# Patient Record
Sex: Male | Born: 1995 | Race: Black or African American | Hispanic: No | Marital: Single | Smoking: Never smoker
Health system: Southern US, Community
[De-identification: ages and names within clinical notes are randomized; demographics above are authoritative.]

---
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
## 2014-10-17 ENCOUNTER — Emergency Department (HOSPITAL_COMMUNITY)
Admission: EM | Admit: 2014-10-17 | Discharge: 2014-10-17 | Disposition: A | Discharge status: Home / Self Care | Payer: Medicaid Other | Attending: Emergency Medicine | Admitting: Emergency Medicine

## 2014-10-17 ENCOUNTER — Encounter (HOSPITAL_COMMUNITY): Payer: Self-pay

## 2014-10-17 DIAGNOSIS — R197 Diarrhea, unspecified: Secondary | ICD-10-CM | POA: Diagnosis present

## 2014-10-17 DIAGNOSIS — R112 Nausea with vomiting, unspecified: Secondary | ICD-10-CM | POA: Diagnosis not present

## 2014-10-17 LAB — COMPREHENSIVE METABOLIC PANEL
ALK PHOS: 60 U/L (ref 39–117)
ALT: 9 U/L (ref 0–53)
AST: 18 U/L (ref 0–37)
Albumin: 4.3 g/dL (ref 3.5–5.2)
Anion gap: 14 (ref 5–15)
BILIRUBIN TOTAL: 1.2 mg/dL (ref 0.3–1.2)
BUN: 10 mg/dL (ref 6–23)
CHLORIDE: 102 meq/L (ref 96–112)
CO2: 25 meq/L (ref 19–32)
CREATININE: 0.88 mg/dL (ref 0.50–1.35)
Calcium: 9.6 mg/dL (ref 8.4–10.5)
GFR calc Af Amer: >90 mL/min (ref >90)
Glucose, Bld: 81 mg/dL (ref 70–99)
POTASSIUM: 4 meq/L (ref 3.7–5.3)
SODIUM: 141 meq/L (ref 137–147)
Total Protein: 6.8 g/dL (ref 6.0–8.3)

## 2014-10-17 LAB — CBC
HCT: 42.1 % (ref 39.0–52.0)
Hemoglobin: 13.9 g/dL (ref 13.0–17.0)
MCH: 30.5 pg (ref 26.0–34.0)
MCHC: 33 g/dL (ref 30.0–36.0)
MCV: 92.3 fL (ref 78.0–100.0)
PLATELETS: 190 10*3/uL (ref 150–400)
RBC: 4.56 MIL/uL (ref 4.22–5.81)
RDW: 13.2 % (ref 11.5–15.5)
WBC: 6.7 10*3/uL (ref 4.0–10.5)

## 2014-10-17 LAB — LIPASE, BLOOD: Lipase: 21 U/L (ref 11–59)

## 2014-10-17 LAB — I-STAT CG4 LACTIC ACID, ED: Lactic Acid, Venous: 0.86 mmol/L (ref 0.5–2.2)

## 2014-10-17 MED ORDER — SODIUM CHLORIDE 0.9 % IV BOLUS (SEPSIS)
1000.0000 mL | Freq: Once | INTRAVENOUS | Status: AC
  Administered 2014-10-17: 1000 mL via INTRAVENOUS

## 2014-10-17 MED ORDER — ONDANSETRON HCL 4 MG/2ML IJ SOLN
4.0000 mg | Freq: Once | INTRAMUSCULAR | Status: AC
  Administered 2014-10-17: 4 mg via INTRAVENOUS
  Filled 2014-10-17: qty 2

## 2014-10-17 MED ORDER — ONDANSETRON 4 MG PO TBDP: ORAL_TABLET | ORAL | Status: AC

## 2014-10-17 NOTE — Discharge Instructions (Signed)

## 2014-10-17 NOTE — ED Notes (Signed)
Pt notified of needing urine sample.  Urinal at bedside

## 2014-10-17 NOTE — ED Notes (Signed)
Pt able to tolerate PO water and ginger ale, given prescription for nausea medication. Pt states no other needs and is getting dressed to leave at this time.

## 2014-10-17 NOTE — ED Notes (Signed)
Per EMS, pt from home.  Pt states n/v/d x 1 week.  Pt thinks it stems from his depression and stress that he is currently having.  Not taking meds at home.  No prior treatment for depression.  Vitals:  110/80, hr 88, resp 16,

## 2014-10-17 NOTE — ED Notes (Signed)
Ambulated pt through hall, pt tolerated it well. Able to tolerate PO fluids at this time.

## 2014-10-17 NOTE — ED Provider Notes (Signed)
CSN: [id number]     Arrival date & time 10/17/14  0910 History   First MD Initiated Contact with Patient 10/17/14 [phone]     Chief Complaint  Patient presents with  . Emesis  . Diarrhea  . Depression     (Consider location/radiation/quality/duration/timing/severity/associated sxs/prior Treatment) Patient is a 18 y.o. male presenting with vomiting and diarrhea. The history is provided by the patient.  Emesis Severity:  Moderate Duration:  4 days Timing:  Constant Quality:  Stomach contents Progression:  Worsening Chronicity:  New Recent urination:  Normal Relieved by:  Nothing Worsened by:  Nothing tried Associated symptoms: diarrhea   Associated symptoms: no abdominal pain, no chills, no cough, no fever, no headaches, no myalgias and no URI   Diarrhea Associated symptoms: vomiting   Associated symptoms: no abdominal pain, no chills, no recent cough, no fever, no headaches, no myalgias and no URI     History reviewed. No pertinent past medical history. History reviewed. No pertinent past surgical history. History reviewed. No pertinent family history. History  Substance Use Topics  . Smoking status: Never Smoker   . Smokeless tobacco: Not on file  . Alcohol Use: No    Review of Systems  Constitutional: Negative for fever and chills.  Respiratory: Negative for cough and shortness of breath.   Cardiovascular: Negative for chest pain.  Gastrointestinal: Positive for nausea, vomiting and diarrhea. Negative for abdominal pain.  Musculoskeletal: Negative for myalgias.  Neurological: Negative for headaches.  Psychiatric/Behavioral: Positive for dysphoric mood (was depressed, has since resolved).  All other systems reviewed and are negative.     Allergies  Review of patient's allergies indicates no known allergies.  Home Medications   Prior to Admission medications   Medication Sig Start Date End Date Taking? Authorizing Provider  HYDROcodone-acetaminophen  (NORCO/VICODIN) 5-325 MG per tablet Take 2 tablets by mouth every 4 (four) hours as needed for pain. 09/09/12   Roxy Horsemanobert Browning, PA-C   BP 108/61 mmHg  Pulse 53  Temp(Src) 98.5 F (36.9 C) (Oral)  Resp 18  SpO2 100% Physical Exam  Constitutional: He is oriented to person, place, and time. He appears well-developed and well-nourished. No distress.  HENT:  Head: Normocephalic and atraumatic.  Mouth/Throat: Oropharynx is clear and moist. No oropharyngeal exudate.  Eyes: EOM are normal. Pupils are equal, round, and reactive to light.  Neck: Normal range of motion. Neck supple.  Cardiovascular: Normal rate and regular rhythm.  Exam reveals no friction rub.   No murmur heard. Pulmonary/Chest: Effort normal and breath sounds normal. No respiratory distress. He has no wheezes. He has no rales.  Abdominal: Soft. He exhibits no distension. There is no tenderness. There is no rebound.  Musculoskeletal: Normal range of motion. He exhibits no edema.  Neurological: He is alert and oriented to person, place, and time.  Skin: No rash noted. He is not diaphoretic.  Nursing note and vitals reviewed.   ED Course  Procedures (including critical care time) Labs Review Labs Reviewed  CBC  COMPREHENSIVE METABOLIC PANEL  LIPASE, BLOOD  I-STAT CG4 LACTIC ACID, ED    Imaging Review No results found.   EKG Interpretation None      MDM   Final diagnoses:  Nausea vomiting and diarrhea    56M presents with nausea, vomiting, diarrhea for past 4 days. Unable to stop vomiting. No dysuria, no abdominal pain. No fevers. No sick contacts.  He states he is depressed, but is over it now. No SI/HI. Here belly benign, vitals  stable. Will give fluids, anti-emetics, check labs. Labs ok. Tolerating PO after meds. Stable for discharge.    Elwin MochaBlair Vadhir Mcnay, MD 10/17/14 [phone]

## 2014-10-17 NOTE — ED Notes (Signed)
Bed: WA22 Expected date:  Expected time:  Means of arrival:  Comments: EMS-N/V 

## 2014-10-20 ENCOUNTER — Emergency Department (HOSPITAL_COMMUNITY)
Admission: EM | Admit: 2014-10-20 | Discharge: 2014-10-20 | Disposition: A | Discharge status: Home / Self Care | Payer: Medicaid Other | Attending: Emergency Medicine | Admitting: Emergency Medicine

## 2014-10-20 ENCOUNTER — Emergency Department (HOSPITAL_COMMUNITY): Payer: Medicaid Other

## 2014-10-20 ENCOUNTER — Encounter (HOSPITAL_COMMUNITY): Payer: Self-pay | Admitting: *Deleted

## 2014-10-20 DIAGNOSIS — R079 Chest pain, unspecified: Secondary | ICD-10-CM

## 2014-10-20 DIAGNOSIS — R0789 Other chest pain: Secondary | ICD-10-CM

## 2014-10-20 DIAGNOSIS — R0602 Shortness of breath: Secondary | ICD-10-CM | POA: Diagnosis not present

## 2014-10-20 LAB — BASIC METABOLIC PANEL
Anion gap: 11 (ref 5–15)
BUN: 5 mg/dL — AB (ref 6–23)
CALCIUM: 10 mg/dL (ref 8.4–10.5)
CO2: 30 mEq/L (ref 19–32)
Chloride: 104 mEq/L (ref 96–112)
Creatinine, Ser: 1.05 mg/dL (ref 0.50–1.35)
GFR calc Af Amer: >90 mL/min (ref >90)
GLUCOSE: 83 mg/dL (ref 70–99)
Potassium: 3.8 mEq/L (ref 3.7–5.3)
Sodium: 145 mEq/L (ref 137–147)

## 2014-10-20 LAB — D-DIMER, QUANTITATIVE: D-Dimer, Quant: <0.27 ug/mL-FEU (ref 0.00–0.48)

## 2014-10-20 LAB — CBC
HEMATOCRIT: 43.3 % (ref 39.0–52.0)
Hemoglobin: 14.1 g/dL (ref 13.0–17.0)
MCH: 30.1 pg (ref 26.0–34.0)
MCHC: 32.6 g/dL (ref 30.0–36.0)
MCV: 92.3 fL (ref 78.0–100.0)
Platelets: 198 10*3/uL (ref 150–400)
RBC: 4.69 MIL/uL (ref 4.22–5.81)
RDW: 13 % (ref 11.5–15.5)
WBC: 4.5 10*3/uL (ref 4.0–10.5)

## 2014-10-20 LAB — I-STAT TROPONIN, ED: Troponin i, poc: 0 ng/mL (ref 0.00–0.08)

## 2014-10-20 MED ORDER — IBUPROFEN 800 MG PO TABS: 800.0000 mg | ORAL_TABLET | Freq: Three times a day (TID) | ORAL | Status: DC | PRN

## 2014-10-20 MED ORDER — KETOROLAC TROMETHAMINE 60 MG/2ML IM SOLN
60.0000 mg | Freq: Once | INTRAMUSCULAR | Status: AC
  Administered 2014-10-20: 60 mg via INTRAMUSCULAR
  Filled 2014-10-20: qty 2

## 2014-10-20 MED ORDER — HYDROCODONE-ACETAMINOPHEN 5-325 MG PO TABS: 1.0000 | ORAL_TABLET | Freq: Four times a day (QID) | ORAL | Status: DC | PRN

## 2014-10-20 NOTE — ED Provider Notes (Signed)
CSN: [id number]     Arrival date & time 10/20/14  1522 History   First MD Initiated Contact with Patient 10/20/14 1701     Chief Complaint  Patient presents with  . Chest Pain     (Consider location/radiation/quality/duration/timing/severity/associated sxs/prior Treatment) HPI Patient presents to the emergency department with chest pain that started one week ago.  The patient states that he has also noticed that he has had muscle soreness from his job, which he does a lot of lifting.  Patient states that when he takes a deep breath it hurts more and it makes him short of breath when the pain increases.  Patient was recently seen for nausea, vomiting, diarrhea.  Patient states that his nausea and vomiting have subsided, but he is now still having the pain in his chest.  He should states that the pain does not radiate.  Patient denies headache, blurred vision, weakness, dizziness, back pain, neck pain, dysuria, abdominal pain, fever, cough, runny nose, sore throat, decreased appetite, polyuria, polydipsia, rash, or syncope.  The patient states that nothing seems to make his condition, better History reviewed. No pertinent past medical history. History reviewed. No pertinent past surgical history. No family history on file. History  Substance Use Topics  . Smoking status: Never Smoker   . Smokeless tobacco: Not on file  . Alcohol Use: No    Review of Systems  All other systems negative except as documented in the HPI. All pertinent positives and negatives as reviewed in the HPI.=  Allergies  Review of patient's allergies indicates no known allergies.  Home Medications   Prior to Admission medications   Medication Sig Start Date End Date Taking? Authorizing Provider  HYDROcodone-acetaminophen (NORCO/VICODIN) 5-325 MG per tablet Take 1 tablet by mouth every 6 (six) hours as needed for moderate pain. 10/20/14   Jamesetta Orleanshristopher W Jinger Middlesworth, PA-C  ibuprofen (ADVIL,MOTRIN) 800 MG tablet Take 1  tablet (800 mg total) by mouth every 8 (eight) hours as needed. 10/20/14   Jamesetta Orleanshristopher W Edrie Ehrich, PA-C  ondansetron (ZOFRAN ODT) 4 MG disintegrating tablet 4mg  ODT q4 hours prn nausea/vomit Patient not taking: Reported on 10/20/2014 10/17/14   Elwin MochaBlair Walden, MD   BP 122/64 mmHg  Pulse 62  Temp(Src) 98.7 F (37.1 C) (Oral)  Resp 16  SpO2 97% Physical Exam  Constitutional: He is oriented to person, place, and time. He appears well-developed and well-nourished. No distress.  HENT:  Head: Normocephalic and atraumatic.  Mouth/Throat: Oropharynx is clear and moist.  Eyes: Pupils are equal, round, and reactive to light.  Neck: Normal range of motion. Neck supple.  Cardiovascular: Normal rate, regular rhythm and normal heart sounds.  Exam reveals no gallop.   No murmur heard. Pulmonary/Chest: Effort normal and breath sounds normal. No respiratory distress. He has no wheezes. He has no rales. He exhibits tenderness.  Abdominal: Soft. Bowel sounds are normal. He exhibits no distension. There is no tenderness.  Musculoskeletal: He exhibits no edema.  Neurological: He is alert and oriented to person, place, and time. He exhibits normal muscle tone. Coordination normal.  Skin: Skin is warm and dry. No rash noted. No erythema.  Nursing note and vitals reviewed.   ED Course  Procedures (including critical care time) Labs Review Labs Reviewed  BASIC METABOLIC PANEL - Abnormal; Notable for the following:    BUN 5 (*)    All other components within normal limits  CBC  D-DIMER, QUANTITATIVE  I-STAT TROPOININ, ED    Imaging Review Dg Chest 2  View  10/20/2014   CLINICAL DATA:  One week history of cough and shortness of breath; midportion chest pain  EXAM: CHEST  2 VIEW  COMPARISON:  None.  FINDINGS: Lungs are clear. The heart size and pulmonary vascularity are normal. No pneumothorax. No adenopathy. No bone lesions.  IMPRESSION: No abnormality noted.   Electronically Signed   By: Bretta BangWilliam  Woodruff  M.D.   On: 10/20/2014 16:45     EKG Interpretation   Date/Time:  Monday October 20 2014 15:32:52 EST Ventricular Rate:  56 PR Interval:  150 QRS Duration: 90 QT Interval:  377 QTC Calculation: 364 R Axis:   85 Text Interpretation:  Sinus rhythm ST elev, probable normal early repol  pattern No prior for comparison Confirmed by Gwendolyn GrantWALDEN  MD, BLAIR ([id number]) on  10/20/2014 7:26:46 PM     Patient is having atypical chest pain for cardiac disease (ongoing chest soreness in the mid sternal region for the last week.  Pain is reproducible and has hurt more with movements.  Patient has not had any shortness of breath other than when the pain occurs.  He denies dizziness, or vomiting at this time.  Patient is advised to return here as needed MDM   Final diagnoses:  Chest wall pain       Carlyle DollyChristopher W Lolita Faulds, PA-C 10/21/14 0054  Elwin MochaBlair Walden, MD 10/21/14 [phone]

## 2014-10-20 NOTE — ED Notes (Addendum)
Pt reports mid-sternal cp x 4 days ago with SOB, dizziness, and vomiting.  Pt describes pain as tight pressure, states pain is non-radiating.  However, has general body pains from work.  Pt reports pain in his chest is worse when taking a deep breath or with movement

## 2014-10-20 NOTE — Discharge Instructions (Signed)
Return here as needed.  Follow up with a primary care doctor or [facility] urgent care.  Your testing here today was normal.  This is most likely chest wall pain based on your testing

## 2014-11-02 ENCOUNTER — Emergency Department (HOSPITAL_COMMUNITY)
Admission: EM | Admit: 2014-11-02 | Discharge: 2014-11-03 | Disposition: A | Discharge status: Home / Self Care | Payer: Medicaid Other | Attending: Emergency Medicine | Admitting: Emergency Medicine

## 2014-11-02 ENCOUNTER — Encounter (HOSPITAL_COMMUNITY): Payer: Self-pay | Admitting: Emergency Medicine

## 2014-11-02 ENCOUNTER — Emergency Department (HOSPITAL_COMMUNITY): Payer: Medicaid Other

## 2014-11-02 DIAGNOSIS — R079 Chest pain, unspecified: Secondary | ICD-10-CM

## 2014-11-02 DIAGNOSIS — R0789 Other chest pain: Secondary | ICD-10-CM | POA: Insufficient documentation

## 2014-11-02 DIAGNOSIS — R0602 Shortness of breath: Secondary | ICD-10-CM | POA: Insufficient documentation

## 2014-11-02 LAB — CBC
HCT: 45.9 % (ref 39.0–52.0)
Hemoglobin: 15.6 g/dL (ref 13.0–17.0)
MCH: 30.5 pg (ref 26.0–34.0)
MCHC: 34 g/dL (ref 30.0–36.0)
MCV: 89.6 fL (ref 78.0–100.0)
PLATELETS: 204 10*3/uL (ref 150–400)
RBC: 5.12 MIL/uL (ref 4.22–5.81)
RDW: 13 % (ref 11.5–15.5)
WBC: 7.6 10*3/uL (ref 4.0–10.5)

## 2014-11-02 LAB — I-STAT TROPONIN, ED: Troponin i, poc: 0.01 ng/mL (ref 0.00–0.08)

## 2014-11-02 NOTE — ED Notes (Signed)
The pt is c/o generalized chest pain for 2-3 weeks with some n v sob and dizziness.  He has been seen x2 in that time.  His last hosp visit was 2 days ago. Unknown diagnosis.  He has been coughing up bright red blood according to the pt

## 2014-11-02 NOTE — ED Notes (Signed)
Pt. reports mid chest pain onset this evening with SOB and nausea  / occasional dry cough .

## 2014-11-02 NOTE — ED Provider Notes (Signed)
CSN: [id number]     Arrival date & time 11/02/14  2322 History  This chart was scribed for Olivia Mackie, MD by Annye Asa, ED Scribe. This patient was seen in room D34C/D34C and the patient's care was started at 12:03 AM.    Chief Complaint  Patient presents with  . Chest Pain   The history is provided by the patient. No language interpreter was used.     HPI Comments: Alex Sellers is an otherwise healthy 19 y.o. male who presents to the Emergency Department complaining of 2-3 weeks of intermittent chest pains, acutely worsening 2 hours PTA and lasting 1.5 hours before improving. He describes this as a pressure or "heaviness" in the right sided chest and central chest. He also reports SOB with dizziness, lightheadedness, and rapid breathing. He reports mild pain in the central chest at present. He denies tingling in hands, feet, or mouth with this episode.   The last instance of similar symptoms was 3 days PTA - he believes he has two episodes of symptoms per week. He cannot specify any factors that bring on his symptoms. Companion reports that patient was told at [facility] 1 month PTA that he had a heart condition; patient does not recall what specific condition.   Patient denies smoking or illicit drug use. He reports that he recently began a physically strenuous job.   Prior records reviewed, patient was diagnosed with chest wall pain.  At his most recent ED visit.  His EKG, chest x-ray and lab work was all normal.  There is no documentation of any heart condition.  Patient has an odd affect, seems unconcerned that he is in the emergency department, slightly somnolent and laughing at some questions.  History reviewed. No pertinent past medical history. History reviewed. No pertinent past surgical history. No family history on file. History  Substance Use Topics  . Smoking status: Never Smoker   . Smokeless tobacco: Not on file  . Alcohol Use: No    Review of Systems  Respiratory:  Positive for shortness of breath.   Cardiovascular: Positive for chest pain.  Neurological: Positive for dizziness and light-headedness.    Allergies  Review of patient's allergies indicates no known allergies.  Home Medications   Prior to Admission medications   Medication Sig Start Date End Date Taking? Authorizing Provider  HYDROcodone-acetaminophen (NORCO/VICODIN) 5-325 MG per tablet Take 1 tablet by mouth every 6 (six) hours as needed for moderate pain. 10/20/14   Jamesetta Orleans Lawyer, PA-C  ibuprofen (ADVIL,MOTRIN) 800 MG tablet Take 1 tablet (800 mg total) by mouth every 8 (eight) hours as needed. 10/20/14   Carlyle Dolly, PA-C  ondansetron (ZOFRAN ODT) 4 MG disintegrating tablet  ODT q4 hours prn nausea/vomit Patient not taking: Reported on 10/20/2014 10/17/14   Elwin Mocha, MD   There were no vitals taken for this visit. Physical Exam  Constitutional: He is oriented to person, place, and time. He appears well-developed and well-nourished. No distress.  Patient appears somnolent but is arousable.  HENT:  Head: Normocephalic and atraumatic.  Nose: Nose normal.  Mouth/Throat: Oropharynx is clear and moist.  Eyes: Conjunctivae and EOM are normal. Pupils are equal, round, and reactive to light.  Neck: Normal range of motion. Neck supple. No JVD present. No tracheal deviation present. No thyromegaly present.  Cardiovascular: Normal rate, regular rhythm, normal heart sounds and intact distal pulses.  Exam reveals no gallop and no friction rub.   No murmur heard. Pulmonary/Chest: Effort normal  and breath sounds normal. No stridor. No respiratory distress. He has no wheezes. He has no rales. He exhibits no tenderness.  Abdominal: Soft. Bowel sounds are normal. He exhibits no distension and no mass. There is no tenderness. There is no rebound and no guarding.  Musculoskeletal: Normal range of motion. He exhibits no edema or tenderness.  Lymphadenopathy:    He has no  cervical adenopathy.  Neurological: He is alert and oriented to person, place, and time. He displays normal reflexes. He exhibits normal muscle tone. Coordination normal.  Skin: Skin is warm and dry. No rash noted. No erythema. No pallor.  Psychiatric: He has a normal mood and affect. His behavior is normal. Judgment and thought content normal.  Nursing note and vitals reviewed.   ED Course  Procedures   DIAGNOSTIC STUDIES: Oxygen Saturation is 97% on RA, adequate by my interpretation.    COORDINATION OF CARE: 12:14 AM Discussed treatment plan with pt at bedside and pt agreed to plan.   Labs Review Labs Reviewed  CBC  BASIC METABOLIC PANEL  Rosezena Sensor, ED    Imaging Review Dg Chest 2 View  11/03/2014   CLINICAL DATA:  Right-sided chest pain for 2 hr.  EXAM: CHEST  2 VIEW  COMPARISON:  10/20/2014  FINDINGS: The heart size and mediastinal contours are within normal limits. Both lungs are clear. The visualized skeletal structures are unremarkable.  IMPRESSION: No active cardiopulmonary disease.   Electronically Signed   By: Burman Nieves M.D.   On: 11/03/2014 00:08     EKG Interpretation   Date/Time:  Sunday November 02 2014 23:28:18 EST Ventricular Rate:  64 PR Interval:  150 QRS Duration: 82 QT Interval:  388 QTC Calculation: 400 R Axis:   86 Text Interpretation:  Normal sinus rhythm ST elevation, consider early  repolarization Borderline ECG No significant change since last tracing  Confirmed by Aydin Cavalieri  MD, Robena Ewy ([id number]) on 11/02/2014 11:51:30 PM      MDM   Final diagnoses:  Chest pain with low risk for cardiac etiology  Chest wall pain   19 year old male with intermittent chest pain, recently seen for same.  Differential includes anxiety, chest wall pain.  Patient instructed to follow-up with primary care doctor for further workup if symptoms continue after taking NSAIDs.  I personally performed the services described in this documentation, which was scribed  in my presence. The recorded information has been reviewed and is accurate.      Olivia Mackie, MD 11/03/14 [phone]

## 2014-11-02 NOTE — ED Notes (Signed)
Pt to xray

## 2014-11-03 ENCOUNTER — Emergency Department (HOSPITAL_COMMUNITY)
Admission: EM | Admit: 2014-11-03 | Discharge: 2014-11-03 | Disposition: A | Discharge status: Home / Self Care | Payer: Medicaid Other

## 2014-11-03 LAB — BASIC METABOLIC PANEL
Anion gap: 8 (ref 5–15)
BUN: 9 mg/dL (ref 6–23)
CALCIUM: 9.7 mg/dL (ref 8.4–10.5)
CO2: 26 mmol/L (ref 19–32)
Chloride: 103 mEq/L (ref 96–112)
Creatinine, Ser: 0.94 mg/dL (ref 0.50–1.35)
GFR calc Af Amer: >90 mL/min (ref >90)
GLUCOSE: 91 mg/dL (ref 70–99)
Potassium: 3.6 mmol/L (ref 3.5–5.1)
Sodium: 137 mmol/L (ref 135–145)

## 2014-11-03 MED ORDER — NAPROXEN 250 MG PO TABS
500.0000 mg | ORAL_TABLET | Freq: Once | ORAL | Status: AC
  Administered 2014-11-03: 500 mg via ORAL
  Filled 2014-11-03: qty 2

## 2014-11-03 MED ORDER — NAPROXEN 500 MG PO TABS: 500.0000 mg | ORAL_TABLET | Freq: Two times a day (BID) | ORAL | Status: AC

## 2014-11-03 NOTE — ED Notes (Signed)
Pt threw his stickers away and walked out of the waiting room

## 2014-11-03 NOTE — Discharge Instructions (Signed)
You do not have a heart condition.  Ear pain appears to be located in your chest wall and may be due to your recent change in jobs.  Take medications as prescribed.  If your symptoms are not improving in the next 2 weeks, contact your primary care doctor for further workup.   Chest Wall Pain Chest wall pain is pain in or around the bones and muscles of your chest. It may take up to 6 weeks to get better. It may take longer if you must stay physically active in your work and activities.  CAUSES  Chest wall pain may happen on its own. However, it may be caused by:  A viral illness like the flu.  Injury.  Coughing.  Exercise.  Arthritis.  Fibromyalgia.  Shingles. HOME CARE INSTRUCTIONS   Avoid overtiring physical activity. Try not to strain or perform activities that cause pain. This includes any activities using your chest or your abdominal and side muscles, especially if heavy weights are used.  Put ice on the sore area.  Put ice in a plastic bag.  Place a towel between your skin and the bag.  Leave the ice on for 15-20 minutes per hour while awake for the first 2 days.  Only take over-the-counter or prescription medicines for pain, discomfort, or fever as directed by your caregiver. SEEK IMMEDIATE MEDICAL CARE IF:   Your pain increases, or you are very uncomfortable.  You have a fever.  Your chest pain becomes worse.  You have new, unexplained symptoms.  You have nausea or vomiting.  You feel sweaty or lightheaded.  You have a cough with phlegm (sputum), or you cough up blood. MAKE SURE YOU:   Understand these instructions.  Will watch your condition.  Will get help right away if you are not doing well or get worse. Document Released: 10/17/2005 Document Revised: 01/09/2012 Document Reviewed: 06/13/2011 [facility] Patient Information 2015 [place], [place]. This information is not intended to replace advice given to you by your health care provider. Make sure you  discuss any questions you have with your health care provider.  Costochondritis Costochondritis is a condition in which the tissue (cartilage) that connects your ribs with your breastbone (sternum) becomes irritated. It causes pain in the chest and rib area. It usually goes away on its own over time. HOME CARE  Avoid activities that wear you out.  Do not strain your ribs. Avoid activities that use your:  Chest.  Belly.  Side muscles.  Put ice on the area for the first 2 days after the pain starts.  Put ice in a plastic bag.  Place a towel between your skin and the bag.  Leave the ice on for 20 minutes, 2-3 times a day.  Only take medicine as told by your doctor. GET HELP IF:  You have redness or puffiness (swelling) in the rib area.  Your pain does not go away with rest or medicine. GET HELP RIGHT AWAY IF:   Your pain gets worse.  You are very uncomfortable.  You have trouble breathing.  You cough up blood.  You start sweating or throwing up (vomiting).  You have a fever or lasting symptoms for more than 2-3 days.  You have a fever and your symptoms suddenly get worse. MAKE SURE YOU:   Understand these instructions.  Will watch your condition.  Will get help right away if you are not doing well or get worse. Document Released: 04/04/2008 Document Revised: 06/19/2013 Document Reviewed: 05/21/2013 ExitCare  Patient Information 2015 [place], [place]. This information is not intended to replace advice given to you by your health care provider. Make sure you discuss any questions you have with your health care provider.   Emergency Department Resource Guide 1) Find a Doctor and Pay Out of Pocket Although you won't have to find out who is covered by your insurance plan, it is a good idea to ask around and get recommendations. You will then need to call the office and see if the doctor you have chosen will accept you as a new patient and what types of options they  offer for patients who are self-pay. Some doctors offer discounts or will set up payment plans for their patients who do not have insurance, but you will need to ask so you aren't surprised when you get to your appointment.  2) Contact Your Local Health Department Not all health departments have doctors that can see patients for sick visits, but many do, so it is worth a call to see if yours does. If you don't know where your local health department is, you can check in your phone book. The CDC also has a tool to help you locate your state's health department, and many state websites also have listings of all of their local health departments.  3) Find a Walk-in Clinic If your illness is not likely to be very severe or complicated, you may want to try a walk in clinic. These are popping up all over the country in pharmacies, drugstores, and shopping centers. They're usually staffed by nurse practitioners or physician assistants that have been trained to treat common illnesses and complaints. They're usually fairly quick and inexpensive. However, if you have serious medical issues or chronic medical problems, these are probably not your best option.  No Primary Care Doctor: - Call Health Connect at  [phone] - they can help you locate a primary care doctor that  accepts your insurance, provides certain services, etc. - Physician Referral Service- [phone]  Chronic Pain Problems: Organization         Address  Phone   Notes  [facility]  [phone] Patients need to be referred by their primary care doctor.   Medication Assistance: Organization         Address  Phone   Notes  [facility] Medication [facility] [address] [place] [place], [place] [address] [phone] --Must be a resident of [facility] -- Must have NO insurance coverage whatsoever (no Medicaid/ Medicare, etc.) -- The pt. MUST have a primary care doctor that directs their care  regularly and follows them in the community   [facility]ist  [phone]   [employer]  [phone]    Agencies that provide inexpensive medical care: Organization         Address  Phone   Notes  [facility]  [phone]   [facility]    [phone]   [facility] [address] [place], [place] [address] [phone]   [facility] [address], [place] [phone]   [facility]anned Parenthood    [phone]   [facility]    [phone]   [facility]  [address], [place] Phone:  [phone], Fax:  [phone] Hours of Operation:  9 am - 6 pm, M-F.  Also accepts Medicaid/Medicare and self-pay.  [facility]  [address], [place] Phone: [phone], Fax: [phone]. Hours of Operation:  8:30 am - 5:30 pm, M-F.  Also accepts Medicaid and self-pay.  [facility] [place] [address] [place] Phone: [phone]   [facility] [address] Natasha Bence [place], [place] [phone], Ext. [phone] Mondays & Thursdays: 7-9 AM.  First 15 patients are seen on a first come, first serve basis.    Medicaid-accepting [facility] Providers:  Organization         Address  Phone   Notes  [facility] [address], [place] [phone] Also accepts self-pay patients.  [facility] [address] Laurell Josephs [place], [place]  [phone]   [facility] [address], [place] [phone]   [facility] [address] [place] [phone]   Renaye Rakers [address], [place]   [phone] Only accepts [place] Access [place] patients after they have their name applied to their card.   Self-Pay (no insurance) in [facility]:  Organization         Address  Phone    Notes  Sickle Cell Patients, [facility] [address] [place], [place] [phone]   [facility] [address] [place], [place] [phone]   [facility] [place]  [address],  [phone]   [facility]/Dr. Osei-Bonsu  [address], [place] or [address], [place] [phone] Phone number for both [place] and [place] locations is the same.  [facility] [address], [place] [phone]   [facility] [address], [place] or [address] [phone]   [facility] [address] [place] [phone], phone; [phone], fax Sees patients 1st and 3rd Saturday of every month.  Must not qualify for public or private insurance (i.e. Medicaid, Medicare, [place] Health Choice, Veterans' Benefits)  Household income should be no more than 200% of the poverty level The clinic cannot treat you if you are pregnant or think you are pregnant  Sexually transmitted diseases are not treated at the clinic.    Dental Care: Organization         Address  Phone  Notes  [facility] [address] [place], [place] [phone] Accepts children up to age 35 who are enrolled in [place] or [place] Health Choice; pregnant women with a Medicaid card; and children who have applied for Medicaid or [place] Health Choice, but were declined, whose parents can pay a reduced fee at time of service.  [facility]  [address] [place] [address], [place] [phone] Accepts children up to age 78 who are enrolled in [place] or [place] Health Choice; pregnant women with a Medicaid card; and children who have applied for Medicaid or [place] Health Choice, but were declined, whose parents can pay a reduced fee at time of service.  [facility]  [address] [place], [place] [phone] Patients are seen by appointment only. Walk-ins are not accepted. [facility] will see patients 90 years of age and older. Monday - Tuesday (8am-5pm) Most Wednesdays (8:30-5pm) $30 per visit, cash only  [facility]  [address], [facility] [phone] Patients are seen by appointment only. Walk-ins are not accepted. [facility] will see patients 52 years of age and older. One Wednesday Evening (Monthly: Volunteer Based).  $30 per visit, cash only  [employer]  [phone] for adults; Children under age 63, call Graduate Pediatric Dentistry at [phone]. Children aged 50-14, please call [phone] to request a pediatric application.  Dental services are provided in all areas of dental care including fillings, crowns and bridges, complete and partial dentures, implants, gum treatment, root canals, and extractions. Preventive care is also provided. Treatment is provided to both adults and children. Patients are selected via a lottery and there is often a waiting list.   [facility] [address] [place]  [phone] [email or web address]   [facility] [address] [place], [place] [phone], Ext. [phone] Second and Fourth Thursday of each month, opens at 6:30 AM; Clinic ends at 9 AM.  Patients are seen on a first-come first-served basis, and a limited number are seen during each clinic.   [facility]  [address] Ether Griffins [place], [place] [phone]   Eligibility Requirements You must have lived in [place], [place], or [place] counties for at least the last three months.   You cannot be eligible for state or federal sponsored [place], including [employer], [place], or [employer].   You generally cannot be eligible for healthcare insurance through your employer.    How to  apply: Eligibility screenings are held every Tuesday and Wednesday afternoon from 1:00 pm until 4:00 pm. You do not need an appointment for the interview!  [facility] [address] [place], [place] [phone]   [facility] Health Department  [phone]   [facility] Health Department  [phone]   [facility] Health Department  [phone]    Behavioral Health Resources in the Community: Intensive Outpatient Programs Organization         Address  Phone  Notes  [facility] [address], [place], [place] [phone]   [facility] Outpatient [address], [place], [place] [phone]   [facility] [address] [place], [place]  [phone]   [facility] [address]  [place], [place] [phone] or [phone]   Substance Abuse Resources Organization         Address  Phone  Notes  [facility]  [phone]   [facility]  [phone]   [facility] [place]  [phone]   Floydene Flock  [phone]   Residential & Outpatient Substance Abuse Program  [phone]   Psychological Services Organization         Address  Phone  Notes  [facility]  [phone]   [facility]  [phone]   [facility] [address] [place] [phone] or [phone]    Mobile Crisis Teams Organization         Address  Phone  Notes  [facility], Mobile Crisis Care Unit  [phone]   [facility]  [address] [place], [place] [phone]   Doristine Locks [address] [place] [phone]    Self-Help/Support Groups Organization         Address  Phone             Notes  [facility] [place] - variety of support groups  336- I7437963 Call for more information  Narcotics Anonymous (NA), [facility] [address], [employer] [place]  2 meetings at this location   Statistician         Address  Phone  Notes  [facility] [address],    [place]  [phone]   [facility]  [address] [place] [address], [place], [place] [phone]   [facility] [address] [place], [place] [phone] Admissions: 8am-3pm M-F  [facility] [address]    [place], [place] [phone]   [facility] [address] [place], [place], [place] [phone]   [facility] [address]  [place], [place] [phone]   [facility] - Intensive Outpatient [address] Laurell Josephs [address], [place], [place] [phone]   [facility] ([facility] [address] [place]  [place], [place] [phone] or [phone]   [facility] ([facility]) [address], [place], [place] [phone] Accepts Medicaid  [facility] [address]  [place] [phone] Substance Abuse/Addiction Treatment   [facility] Organization         Address  Phone  Notes  [facility]  [phone]   Angie Fava, PhD [address] Ervin Knack [place], [place]   [phone] or [phone]   [facility]   [address] [place], [place] [phone]   [facility] [address], [place], [place] [phone] Insurance/Medicaid/sponsorship through [facility] and Families [address]                                    [place], [place] [phone] Therapy/tele-psych/case  [facility] [address], [place] [phone]    Dr. Lolly Mustache  [phone]   [facility]  [facility] Dept. 1) [address] [place] 2) [address], [place] 3)  [address] [place] [address], [place] [phone]  [phone]   [facility] Child Abuse Hotline  [phone] or [phone] (After Hours)

## 2014-11-03 NOTE — ED Notes (Signed)
Pain med given.  Pt dressed  No discharge instructions.  Pt playing on his cell phone

## 2015-08-13 IMAGING — CR DG CHEST 2V
2 series · 2 of 2 positions shown · non-contrast
Comparison: 10/20/2014

CLINICAL DATA: Right-sided chest pain for 2 hr.

EXAM:
CHEST  2 VIEW

[chest pa]
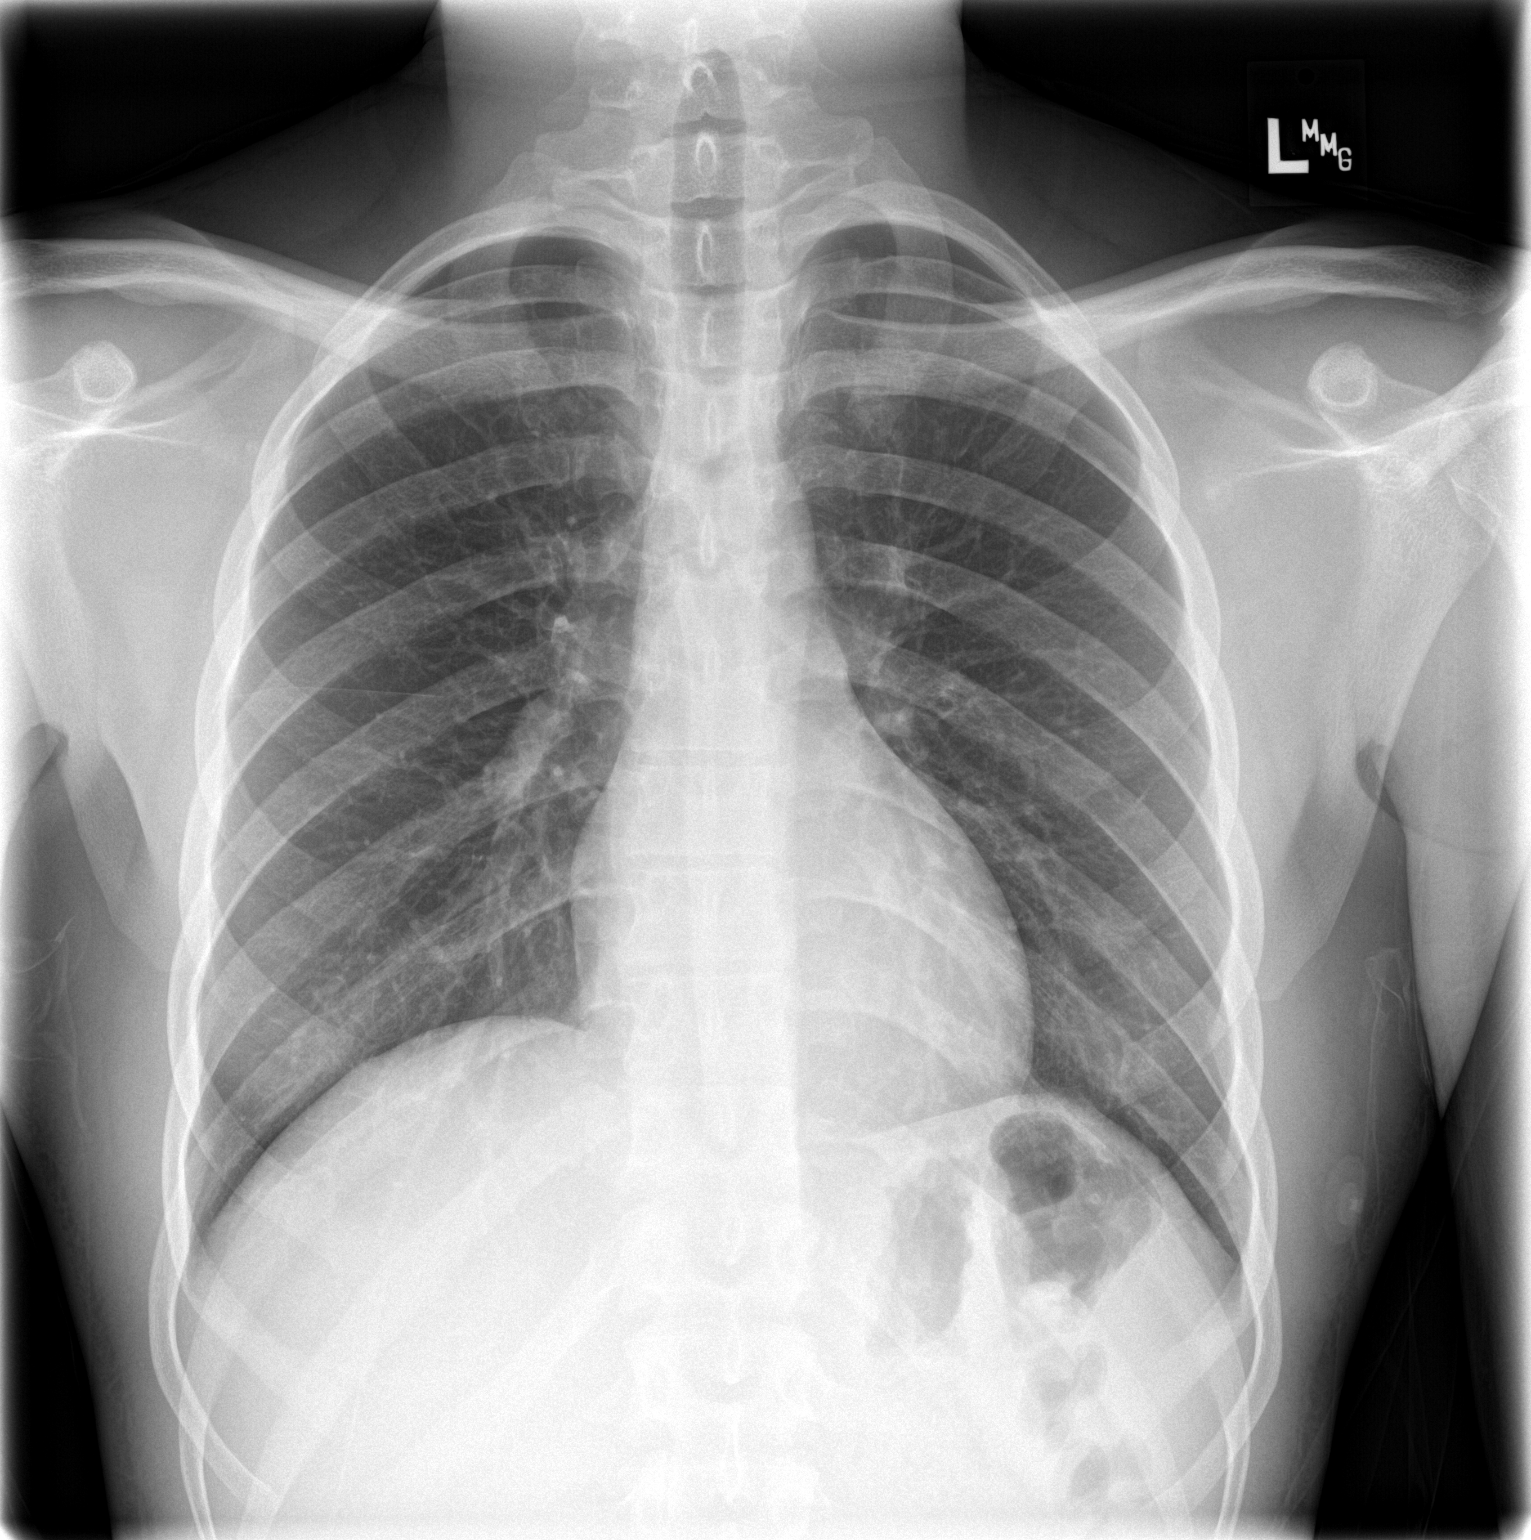

[chest lat]
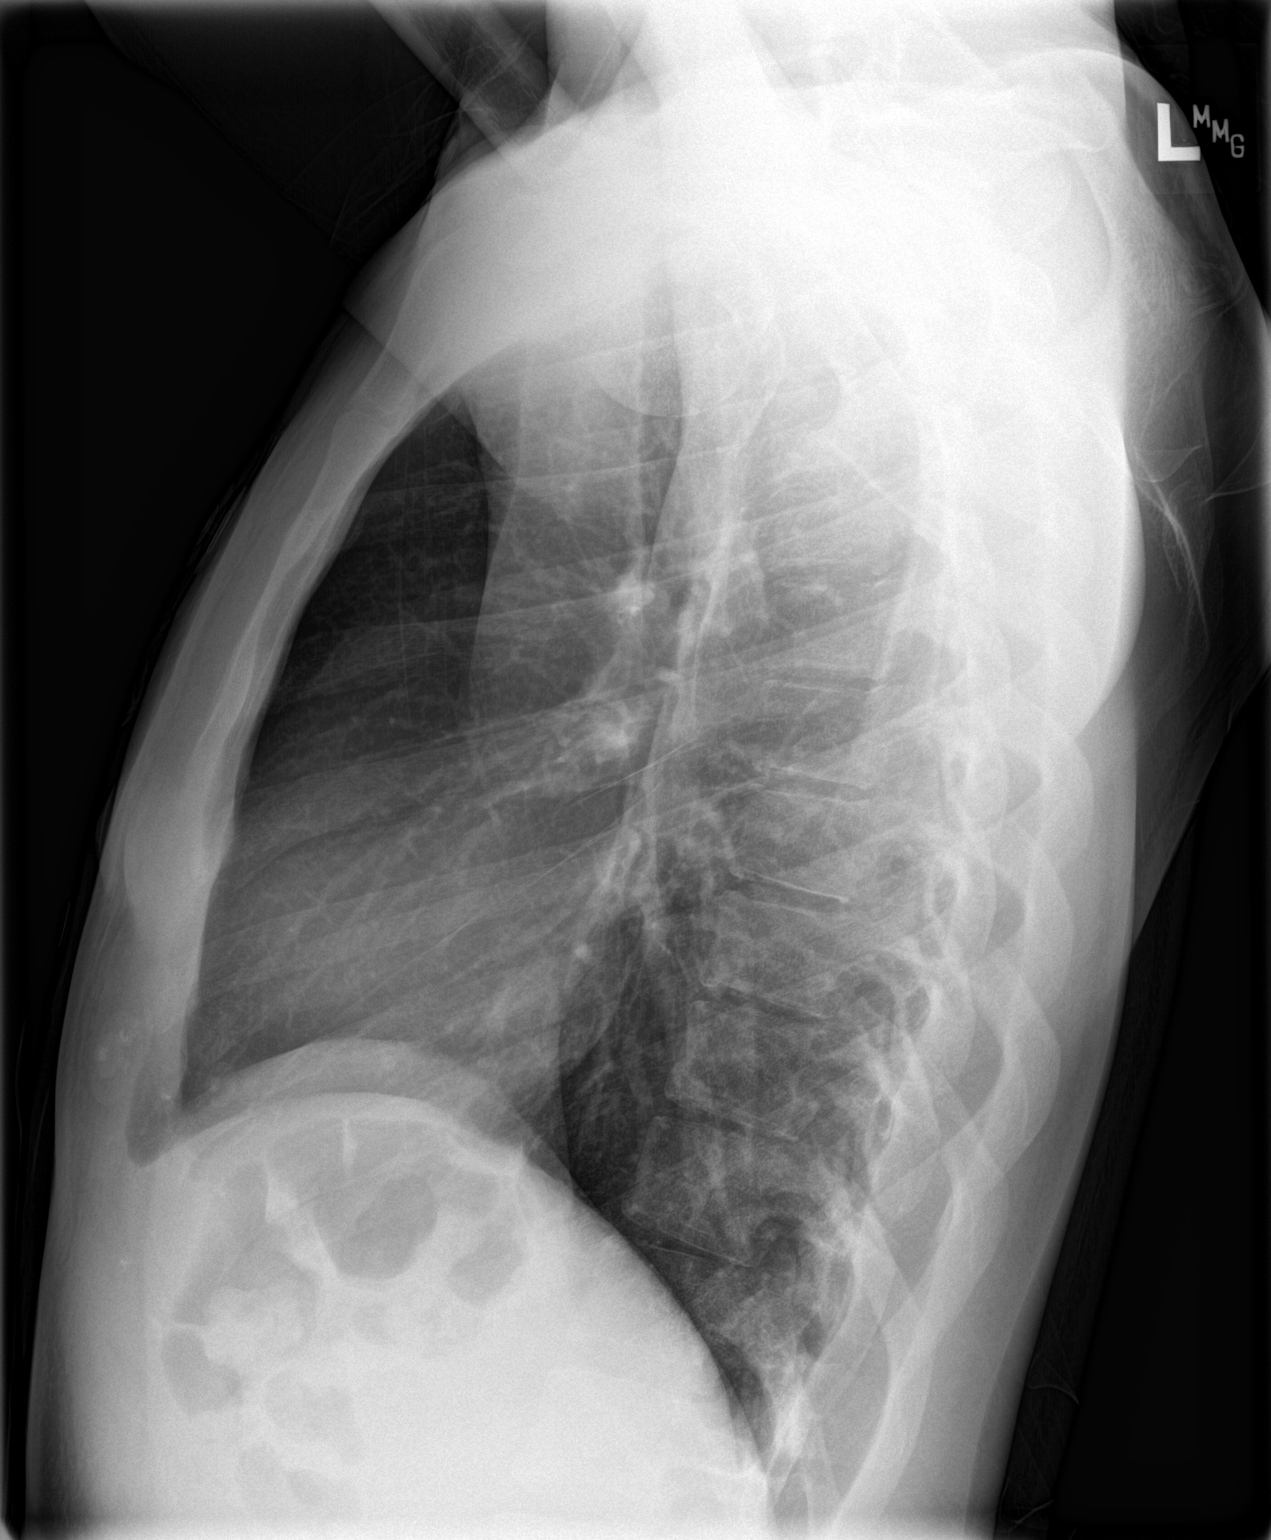

[2 of 2 positions shown; findings below may reference images not displayed]

FINDINGS: The heart size and mediastinal contours are within normal limits.
Both lungs are clear. The visualized skeletal structures are
unremarkable.
IMPRESSION: No active cardiopulmonary disease.

## 2017-02-22 ENCOUNTER — Encounter (HOSPITAL_COMMUNITY): Payer: Self-pay | Admitting: Emergency Medicine

## 2017-02-22 ENCOUNTER — Ambulatory Visit (HOSPITAL_COMMUNITY)
Admission: EM | Admit: 2017-02-22 | Discharge: 2017-02-22 | Disposition: A | Discharge status: Home / Self Care | Payer: Medicaid Other | Attending: Family Medicine | Admitting: Family Medicine

## 2017-02-22 DIAGNOSIS — H1012 Acute atopic conjunctivitis, left eye: Secondary | ICD-10-CM

## 2017-02-22 MED ORDER — CROMOLYN SODIUM 4 % OP SOLN: 2.0000 [drp] | Freq: Four times a day (QID) | OPHTHALMIC | 2 refills | Status: AC

## 2017-02-22 NOTE — ED Triage Notes (Signed)
2 day history of left eye redness, swelling, crusty drainage around eye.  Patient has had cold symptoms-onset saturday

## 2017-02-22 NOTE — ED Provider Notes (Signed)
CSN: [id number]     Arrival date & time 02/22/17  1033 History   First MD Initiated Contact with Patient 02/22/17 1151     Chief Complaint  Patient presents with  . URI   (Consider location/radiation/quality/duration/timing/severity/associated sxs/prior Treatment) 21 year old male presents to clinic with congestion, itchy eyes, and eye redness.   The history is provided by the patient.  Eye Problem  Location:  Left eye Quality:  Tearing (itching) Severity:  Moderate Onset quality:  Gradual Duration:  3 days Timing:  Constant Progression:  Worsening Chronicity:  New Context: not burn, not chemical exposure, not contact lens problem, not direct trauma, not foreign body, not scratch, not smoke exposure and not UV exposure   Exacerbated by: eye drops. Associated symptoms: crusting, discharge (clear), itching, redness and tearing   Associated symptoms: no blurred vision, no decreased vision, no double vision, no headaches and no photophobia   Risk factors: recent URI     History reviewed. No pertinent past medical history. History reviewed. No pertinent surgical history. No family history on file. Social History  Substance Use Topics  . Smoking status: Never Smoker  . Smokeless tobacco: Not on file  . Alcohol use Yes    Review of Systems  Constitutional: Negative for chills and fever.  HENT: Positive for congestion and rhinorrhea.   Eyes: Positive for discharge (clear), redness and itching. Negative for blurred vision, double vision and photophobia.  Respiratory: Negative for cough and shortness of breath.   Cardiovascular: Negative for chest pain and palpitations.  Gastrointestinal: Negative.   Musculoskeletal: Negative.   Skin: Negative.   Neurological: Negative for dizziness and headaches.    Allergies  Patient has no known allergies.  Home Medications   Prior to Admission medications   Medication Sig Start Date End Date Taking? Authorizing Provider  cromolyn  (OPTICROM) 4 % ophthalmic solution Place 2 drops into both eyes 4 (four) times daily. 02/22/17   Dorena Bodo, NP  naproxen (NAPROSYN) 500 MG tablet Take 1 tablet (500 mg total) by mouth 2 (two) times daily with a meal. 11/03/14   Marisa Severin, MD  ondansetron (ZOFRAN ODT) 4 MG disintegrating tablet  ODT q4 hours prn nausea/vomit Patient not taking: Reported on 10/20/2014 10/17/14   Elwin Mocha, MD   Meds Ordered and Administered this Visit  Medications - No data to display  BP 111/76 (BP Location: Right Arm)   Pulse (!) 55   Temp 98.3 F (36.8 C) (Oral)   Resp 18   SpO2 99%  No data found.   Physical Exam  Constitutional: He appears well-developed and well-nourished. No distress.  HENT:  Head: Normocephalic and atraumatic.  Right Ear: Tympanic membrane and external ear normal.  Left Ear: Tympanic membrane and external ear normal.  Nose: Nose normal.  Mouth/Throat: Uvula is midline, oropharynx is clear and moist and mucous membranes are normal.  Eyes: EOM and lids are normal. Pupils are equal, round, and reactive to light. Right conjunctiva is injected. Left conjunctiva is injected.  Neck: Normal range of motion.  Cardiovascular: Normal rate and regular rhythm.   Pulmonary/Chest: Effort normal and breath sounds normal.  Neurological: He is alert.  Skin: Skin is warm and dry. Capillary refill takes less than 2 seconds. He is not diaphoretic.  Psychiatric: He has a normal mood and affect. His behavior is normal.  Nursing note and vitals reviewed.   Urgent Care Course     Procedures (including critical care time)  Labs Review Labs Reviewed - No  data to display  Imaging Review No results found.    MDM   1. Allergic conjunctivitis of left eye     Given Opticrom eyedrops, recommend over-the-counter antihistamine decongestant combination. Return to clinic in 4 days to one week without any relief, or any time if they worsen    Dorena Bodo, NP 02/22/17 1204

## 2017-02-22 NOTE — Discharge Instructions (Signed)
For your conjunctivitis, I prescribed some eyedrops, place 2 drops in both eyes to 4 times a day. I also recommend over-the-counter antihistamine, decongestant combination such as Zyrtec D or Claritin-D. If you do not have any resolution of your symptoms in 4 days to one week, return to clinic, at any time if they worsen return to clinic.

## 2018-07-31 ENCOUNTER — Other Ambulatory Visit: Payer: Self-pay

## 2018-07-31 ENCOUNTER — Encounter (HOSPITAL_COMMUNITY): Payer: Self-pay

## 2018-07-31 ENCOUNTER — Emergency Department (HOSPITAL_COMMUNITY): Payer: Self-pay

## 2018-07-31 ENCOUNTER — Emergency Department (HOSPITAL_COMMUNITY)
Admission: EM | Admit: 2018-07-31 | Discharge: 2018-07-31 | Disposition: A | Discharge status: Home / Self Care | Payer: Self-pay | Attending: Emergency Medicine | Admitting: Emergency Medicine

## 2018-07-31 DIAGNOSIS — N201 Calculus of ureter: Secondary | ICD-10-CM | POA: Insufficient documentation

## 2018-07-31 DIAGNOSIS — Z79899 Other long term (current) drug therapy: Secondary | ICD-10-CM | POA: Insufficient documentation

## 2018-07-31 LAB — COMPREHENSIVE METABOLIC PANEL
ALK PHOS: 63 U/L (ref 38–126)
ALT: 19 U/L (ref 0–44)
AST: 20 U/L (ref 15–41)
Albumin: 4.7 g/dL (ref 3.5–5.0)
Anion gap: 14 (ref 5–15)
BUN: 9 mg/dL (ref 6–20)
CALCIUM: 10.1 mg/dL (ref 8.9–10.3)
CO2: 23 mmol/L (ref 22–32)
Chloride: 107 mmol/L (ref 98–111)
Creatinine, Ser: 1.14 mg/dL (ref 0.61–1.24)
GFR calc Af Amer: >60 mL/min (ref >60)
GFR calc non Af Amer: >60 mL/min (ref >60)
Glucose, Bld: 105 mg/dL — ABNORMAL HIGH (ref 70–99)
POTASSIUM: 3.9 mmol/L (ref 3.5–5.1)
Sodium: 144 mmol/L (ref 135–145)
Total Bilirubin: 1.3 mg/dL — ABNORMAL HIGH (ref 0.3–1.2)
Total Protein: 7.3 g/dL (ref 6.5–8.1)

## 2018-07-31 LAB — URINALYSIS, ROUTINE W REFLEX MICROSCOPIC
BILIRUBIN URINE: NEGATIVE
Bacteria, UA: NONE SEEN
Bilirubin Urine: NEGATIVE
Glucose, UA: NEGATIVE mg/dL
Glucose, UA: NEGATIVE mg/dL
KETONES UR: 5 mg/dL — AB
Ketones, ur: 20 mg/dL — AB
LEUKOCYTES UA: NEGATIVE
Leukocytes, UA: NEGATIVE
NITRITE: NEGATIVE
Nitrite: NEGATIVE
Protein, ur: 100 mg/dL — AB
Protein, ur: NEGATIVE mg/dL
RBC / HPF: >50 RBC/hpf — ABNORMAL HIGH (ref 0–5)
RBC / HPF: >50 RBC/hpf — ABNORMAL HIGH (ref 0–5)
Specific Gravity, Urine: 1.042 — ABNORMAL HIGH (ref 1.005–1.030)
Specific Gravity, Urine: >1.046 — ABNORMAL HIGH (ref 1.005–1.030)
pH: 5 (ref 5.0–8.0)
pH: 5 (ref 5.0–8.0)

## 2018-07-31 LAB — CBC
HEMATOCRIT: 46.1 % (ref 39.0–52.0)
HEMOGLOBIN: 16 g/dL (ref 13.0–17.0)
MCH: 32.7 pg (ref 26.0–34.0)
MCHC: 34.7 g/dL (ref 30.0–36.0)
MCV: 94.3 fL (ref 78.0–100.0)
Platelets: 192 10*3/uL (ref 150–400)
RBC: 4.89 MIL/uL (ref 4.22–5.81)
RDW: 13.3 % (ref 11.5–15.5)
WBC: 9 10*3/uL (ref 4.0–10.5)

## 2018-07-31 LAB — RAPID URINE DRUG SCREEN, HOSP PERFORMED
Amphetamines: NOT DETECTED
Barbiturates: NOT DETECTED
Benzodiazepines: NOT DETECTED
Cocaine: NOT DETECTED
Opiates: POSITIVE — AB
Tetrahydrocannabinol: POSITIVE — AB

## 2018-07-31 LAB — LIPASE, BLOOD: Lipase: 20 U/L (ref 11–51)

## 2018-07-31 MED ORDER — CEPHALEXIN 500 MG PO CAPS: 500.0000 mg | ORAL_CAPSULE | Freq: Two times a day (BID) | ORAL | 0 refills | Status: AC

## 2018-07-31 MED ORDER — KETOROLAC TROMETHAMINE 30 MG/ML IJ SOLN
30.0000 mg | Freq: Once | INTRAMUSCULAR | Status: AC
  Administered 2018-07-31: 30 mg via INTRAVENOUS
  Filled 2018-07-31: qty 1

## 2018-07-31 MED ORDER — SODIUM CHLORIDE 0.9 % IJ SOLN
INTRAMUSCULAR | Status: AC
  Filled 2018-07-31: qty 50

## 2018-07-31 MED ORDER — IOPAMIDOL (ISOVUE-300) INJECTION 61%
INTRAVENOUS | Status: AC
  Filled 2018-07-31: qty 100

## 2018-07-31 MED ORDER — SODIUM CHLORIDE 0.9 % IV BOLUS
1000.0000 mL | Freq: Once | INTRAVENOUS | Status: AC
Start: 2018-07-31 — End: 2018-07-31
  Administered 2018-07-31: 1000 mL via INTRAVENOUS

## 2018-07-31 MED ORDER — IOPAMIDOL (ISOVUE-300) INJECTION 61%
100.0000 mL | Freq: Once | INTRAVENOUS | Status: AC | PRN
  Administered 2018-07-31: 100 mL via INTRAVENOUS

## 2018-07-31 MED ORDER — ONDANSETRON 8 MG PO TBDP: 8.0000 mg | ORAL_TABLET | Freq: Three times a day (TID) | ORAL | 0 refills | Status: AC | PRN

## 2018-07-31 MED ORDER — ONDANSETRON HCL 4 MG/2ML IJ SOLN
4.0000 mg | Freq: Once | INTRAMUSCULAR | Status: AC
  Administered 2018-07-31: 4 mg via INTRAVENOUS
  Filled 2018-07-31: qty 2

## 2018-07-31 MED ORDER — HYDROMORPHONE HCL 1 MG/ML IJ SOLN
0.5000 mg | Freq: Once | INTRAMUSCULAR | Status: AC
Start: 2018-07-31 — End: 2018-07-31
  Administered 2018-07-31: 0.5 mg via INTRAVENOUS
  Filled 2018-07-31: qty 1

## 2018-07-31 MED ORDER — OXYCODONE-ACETAMINOPHEN 5-325 MG PO TABS: 1.0000 | ORAL_TABLET | Freq: Four times a day (QID) | ORAL | 0 refills | Status: DC | PRN

## 2018-07-31 NOTE — ED Provider Notes (Signed)
[facility]   CSN: [id number] Arrival date & time: 07/31/18  1358     History   Chief Complaint Chief Complaint  Patient presents with  . Abdominal Pain    HPI Alex Sellers is a 22 y.o. male.  Patient c/o rlq abd pain, gradual onset last pm. Pain constant, dull, worsening, non radiating, mod-severe. No dysuria or hematuria. No hx kidney stones. No scrotal or testicular pain. No prior abd surgery. Nausea/decreased appetite. No vomiting or diarrhea. Pt difficult historian, unusual affect. Denies same pain prior.   The history is provided by the patient and the EMS personnel.  Abdominal Pain   Pertinent negatives include fever and headaches.    No past medical history on file.  There are no active problems to display for this patient.   No past surgical history on file.      Home Medications    Prior to Admission medications   Medication Sig Start Date End Date Taking? Authorizing Provider  cromolyn (OPTICROM) 4 % ophthalmic solution Place 2 drops into both eyes 4 (four) times daily. 02/22/17   Dorena Bodo, NP  naproxen (NAPROSYN) 500 MG tablet Take 1 tablet (500 mg total) by mouth 2 (two) times daily with a meal. 11/03/14   Marisa Severin, MD  ondansetron (ZOFRAN ODT) 4 MG disintegrating tablet 4mg  ODT q4 hours prn nausea/vomit Patient not taking: Reported on 10/20/2014 10/17/14   Elwin Mocha, MD    Family History No family history on file.  Social History Social History   Tobacco Use  . Smoking status: Never Smoker  . Smokeless tobacco: Never Used  Substance Use Topics  . Alcohol use: Yes  . Drug use: No     Allergies   Patient has no known allergies.   Review of Systems Review of Systems  Constitutional: Negative for fever.  HENT: Negative for sore throat.   Eyes: Negative for redness.  Respiratory: Negative for shortness of breath.   Cardiovascular: Negative for chest pain.  Gastrointestinal:  Positive for abdominal pain.  Endocrine: Negative for polyuria.  Genitourinary: Negative for flank pain.  Musculoskeletal: Negative for back pain.  Skin: Negative for rash.  Neurological: Negative for headaches.  Hematological: Does not bruise/bleed easily.  Psychiatric/Behavioral: Negative for confusion.     Physical Exam Updated Vital Signs BP 134/84 (BP Location: Right Arm)   Pulse 98   Temp (!) 97.5 F (36.4 C) (Oral)   Resp 16   Ht 1.651 m (5\' 5" )   Wt 70.3 kg   SpO2 100%   BMI 25.79 kg/m   Physical Exam  Constitutional: He appears well-developed and well-nourished.  HENT:  Mouth/Throat: Oropharynx is clear and moist.  Eyes: Conjunctivae are normal. No scleral icterus.  Neck: Neck supple. No tracheal deviation present.  Cardiovascular: Normal rate, regular rhythm, normal heart sounds and intact distal pulses. Exam reveals no gallop and no friction rub.  No murmur heard. Pulmonary/Chest: Effort normal and breath sounds normal. No accessory muscle usage. No respiratory distress.  Abdominal: Soft. Bowel sounds are normal. He exhibits no distension and no mass. There is tenderness. There is no rebound and no guarding. No hernia.  Moderate rlq tenderness.   Genitourinary:  Genitourinary Comments: No cva tenderness. No scrotal or testicular pain/swelling/tenderness.   Musculoskeletal: He exhibits no edema.  Neurological: He is alert.  Skin: Skin is warm and dry.  Psychiatric:  Unusual affect, slow to respond to questions.   Nursing note and vitals reviewed.  ED Treatments / Results  Labs (all labs ordered are listed, but only abnormal results are displayed) Results for orders placed or performed during the hospital encounter of 07/31/18  Lipase, blood  Result Value Ref Range   Lipase 20 11 - 51 U/L  Comprehensive metabolic panel  Result Value Ref Range   Sodium 144 135 - 145 mmol/L   Potassium 3.9 3.5 - 5.1 mmol/L   Chloride 107 98 - 111 mmol/L   CO2 23 22 -  32 mmol/L   Glucose, Bld 105 (H) 70 - 99 mg/dL   BUN 9 6 - 20 mg/dL   Creatinine, Ser 4.09 0.61 - 1.24 mg/dL   Calcium 81.1 8.9 - 91.4 mg/dL   Total Protein 7.3 6.5 - 8.1 g/dL   Albumin 4.7 3.5 - 5.0 g/dL   AST 20 15 - 41 U/L   ALT 19 0 - 44 U/L   Alkaline Phosphatase 63 38 - 126 U/L   Total Bilirubin 1.3 (H) 0.3 - 1.2 mg/dL   GFR calc non Af Amer >60 >60 mL/min   GFR calc Af Amer >60 >60 mL/min   Anion gap 14 5 - 15  CBC  Result Value Ref Range   WBC 9.0 4.0 - 10.5 K/uL   RBC 4.89 4.22 - 5.81 MIL/uL   Hemoglobin 16.0 13.0 - 17.0 g/dL   HCT 78.2 95.6 - 21.3 %   MCV 94.3 78.0 - 100.0 fL   MCH 32.7 26.0 - 34.0 pg   MCHC 34.7 30.0 - 36.0 g/dL   RDW 08.6 57.8 - 46.9 %   Platelets 192 150 - 400 K/uL   Ct Abdomen Pelvis W Contrast  Result Date: 07/31/2018 CLINICAL DATA:  Right lower quadrant abdominal pain. EXAM: CT ABDOMEN AND PELVIS WITH CONTRAST TECHNIQUE: Multidetector CT imaging of the abdomen and pelvis was performed using the standard protocol following bolus administration of intravenous contrast. CONTRAST:  ISOVUE-300 IOPAMIDOL (ISOVUE-300) INJECTION 61% COMPARISON:  None. FINDINGS: Lower chest: No acute abnormality. Hepatobiliary: No focal liver abnormality is seen. No gallstones, gallbladder wall thickening, or biliary dilatation. Pancreas: Unremarkable. No pancreatic ductal dilatation or surrounding inflammatory changes. Spleen: Normal in size without focal abnormality. Adrenals/Urinary Tract: Adrenal glands appear normal. Bilateral nephrolithiasis is noted. Moderate right hydroureteronephrosis is noted secondary to 9 mm calculus in distal right ureter. Urinary bladder is unremarkable. Stomach/Bowel: Stomach is within normal limits. Appendix appears normal. No evidence of bowel wall thickening, distention, or inflammatory changes. Vascular/Lymphatic: No significant vascular findings are present. No enlarged abdominal or pelvic lymph nodes. Reproductive: Prostate is  unremarkable. Other: No abdominal wall hernia or abnormality. No abdominopelvic ascites. Musculoskeletal: No acute or significant osseous findings. IMPRESSION: Bilateral nephrolithiasis. Moderate right hydroureteronephrosis is noted secondary to 9 mm distal right ureteral calculus. Electronically Signed   By: Lupita Raider, M.D.   On: 07/31/2018 16:14    EKG None  Radiology Ct Abdomen Pelvis W Contrast  Result Date: 07/31/2018 CLINICAL DATA:  Right lower quadrant abdominal pain. EXAM: CT ABDOMEN AND PELVIS WITH CONTRAST TECHNIQUE: Multidetector CT imaging of the abdomen and pelvis was performed using the standard protocol following bolus administration of intravenous contrast. CONTRAST:  ISOVUE-300 IOPAMIDOL (ISOVUE-300) INJECTION 61% COMPARISON:  None. FINDINGS: Lower chest: No acute abnormality. Hepatobiliary: No focal liver abnormality is seen. No gallstones, gallbladder wall thickening, or biliary dilatation. Pancreas: Unremarkable. No pancreatic ductal dilatation or surrounding inflammatory changes. Spleen: Normal in size without focal abnormality. Adrenals/Urinary Tract: Adrenal glands appear normal. Bilateral nephrolithiasis is  noted. Moderate right hydroureteronephrosis is noted secondary to 9 mm calculus in distal right ureter. Urinary bladder is unremarkable. Stomach/Bowel: Stomach is within normal limits. Appendix appears normal. No evidence of bowel wall thickening, distention, or inflammatory changes. Vascular/Lymphatic: No significant vascular findings are present. No enlarged abdominal or pelvic lymph nodes. Reproductive: Prostate is unremarkable. Other: No abdominal wall hernia or abnormality. No abdominopelvic ascites. Musculoskeletal: No acute or significant osseous findings. IMPRESSION: Bilateral nephrolithiasis. Moderate right hydroureteronephrosis is noted secondary to 9 mm distal right ureteral calculus. Electronically Signed   By: Lupita Raider, M.D.   On: 07/31/2018 16:14      Procedures Procedures (including critical care time)  Medications Ordered in ED Medications  sodium chloride 0.9 % bolus 1,000 mL (has no administration in time range)  HYDROmorphone (DILAUDID) injection 0.5 mg (has no administration in time range)  ondansetron (ZOFRAN) injection 4 mg (has no administration in time range)     Initial Impression / Assessment and Plan / ED Course  I have reviewed the triage vital signs and the nursing notes.  Pertinent labs & imaging results that were available during my care of the patient were reviewed by me and considered in my medical decision making (see chart for details).  Iv ns bolus. Labs.   Reviewed nursing notes and prior charts for additional history.   Dilaudid .5 mg iv. zofran iv. Imaging ordered.   Ct reviewed - right ureteral stone.   toradol iv.   Pain improved.   Labs reviewed - chem normal. ua pending.   Signed out to Dr Mammie Russian to check ua, reassess pain control.    Final Clinical Impressions(s) / ED Diagnoses   Final diagnoses:  None    ED Discharge Orders    None       Cathren Laine, MD 07/31/18 1624

## 2018-07-31 NOTE — ED Notes (Signed)
Per order of the urologist; we have just sent a repeat u/a with very careful mid-stream technique. He is much more comfortable and in no distress.

## 2018-07-31 NOTE — ED Notes (Signed)
Bed: WA07 Expected date:  Expected time:  Means of arrival:  Comments: EMS-abdominal pain 

## 2018-07-31 NOTE — ED Notes (Signed)
Informed pt that there is a need for urine.

## 2018-07-31 NOTE — ED Provider Notes (Signed)
Patient care assumed at 1600. Patient here for evaluation of right sided pain, has a distal 9 mm ureteral stone. UA is concerning for UTI with bacteria M WBCs present but patient does not have any concerning historical feature for UTI. Discussed with urologist on-call, who recommends repeat UA with midstream urine catch and culture off of repeat UA. Repeat UA is not consistent with UTI, no bacteria, few WBC. Patient continues to be well appearing, pain is gone. Will start antibiotics to help prevent development of infection. Plan to discharge home with close urology follow-up as well as close return precautions.   Tilden Fossa, MD 07/31/18 [phone]

## 2018-07-31 NOTE — Discharge Instructions (Addendum)
It was our pleasure to provide your ER care today - we hope that you feel better.  The ct scan shows a 9 mm kidney stone in the right ureter, as well as bilateral kidney stones.   Strain urine. Drink plenty of fluids.   Take motrin or aleve as need for pain. You may also take percocet as need for pain. No driving for the next 6 hours or when taking percocet. Also, do not take tylenol or acetaminophen containing medication when taking percocet.  Take zofran as need if nauseated.   Follow up with urologist in the next few days if symptoms fail to improve/resolve - see referral - call office for appointment.   Return to ER if worse, new symptoms, fevers, persistent vomiting, intractable pain, other concern.

## 2018-07-31 NOTE — ED Triage Notes (Signed)
He c/o rlq area abd. Pain since yesterday. He is in no distress. He maintains his position of comfort, which is to be in rather a fetal position.

## 2018-08-02 LAB — URINE CULTURE: Culture: NO GROWTH

## 2018-08-16 ENCOUNTER — Encounter (HOSPITAL_COMMUNITY): Payer: Self-pay

## 2018-08-16 ENCOUNTER — Emergency Department (HOSPITAL_COMMUNITY)
Admission: EM | Admit: 2018-08-16 | Discharge: 2018-08-16 | Disposition: A | Discharge status: Home / Self Care | Payer: Self-pay | Attending: Emergency Medicine | Admitting: Emergency Medicine

## 2018-08-16 ENCOUNTER — Other Ambulatory Visit: Payer: Self-pay

## 2018-08-16 ENCOUNTER — Emergency Department (HOSPITAL_COMMUNITY): Payer: Self-pay

## 2018-08-16 DIAGNOSIS — Z79899 Other long term (current) drug therapy: Secondary | ICD-10-CM | POA: Insufficient documentation

## 2018-08-16 DIAGNOSIS — N23 Unspecified renal colic: Secondary | ICD-10-CM | POA: Insufficient documentation

## 2018-08-16 LAB — URINALYSIS, ROUTINE W REFLEX MICROSCOPIC
BILIRUBIN URINE: NEGATIVE
Bacteria, UA: NONE SEEN
GLUCOSE, UA: NEGATIVE mg/dL
Ketones, ur: 20 mg/dL — AB
Leukocytes, UA: NEGATIVE
Nitrite: NEGATIVE
Protein, ur: 100 mg/dL — AB
RBC / HPF: >50 RBC/hpf — ABNORMAL HIGH (ref 0–5)
SPECIFIC GRAVITY, URINE: 1.023 (ref 1.005–1.030)
pH: 6 (ref 5.0–8.0)

## 2018-08-16 LAB — COMPREHENSIVE METABOLIC PANEL
ALT: 16 U/L (ref 0–44)
AST: 32 U/L (ref 15–41)
Albumin: 4.6 g/dL (ref 3.5–5.0)
Alkaline Phosphatase: 62 U/L (ref 38–126)
Anion gap: 17 — ABNORMAL HIGH (ref 5–15)
BUN: 11 mg/dL (ref 6–20)
CHLORIDE: 104 mmol/L (ref 98–111)
CO2: 20 mmol/L — ABNORMAL LOW (ref 22–32)
Calcium: 9.9 mg/dL (ref 8.9–10.3)
Creatinine, Ser: 1.45 mg/dL — ABNORMAL HIGH (ref 0.61–1.24)
Glucose, Bld: 100 mg/dL — ABNORMAL HIGH (ref 70–99)
POTASSIUM: 5.4 mmol/L — AB (ref 3.5–5.1)
Sodium: 141 mmol/L (ref 135–145)
Total Bilirubin: 1.8 mg/dL — ABNORMAL HIGH (ref 0.3–1.2)
Total Protein: 7.5 g/dL (ref 6.5–8.1)

## 2018-08-16 LAB — CBC
HEMATOCRIT: 46.4 % (ref 39.0–52.0)
HEMOGLOBIN: 15.4 g/dL (ref 13.0–17.0)
MCH: 32 pg (ref 26.0–34.0)
MCHC: 33.2 g/dL (ref 30.0–36.0)
MCV: 96.3 fL (ref 80.0–100.0)
NRBC: 0 % (ref 0.0–0.2)
Platelets: 203 10*3/uL (ref 150–400)
RBC: 4.82 MIL/uL (ref 4.22–5.81)
RDW: 13.4 % (ref 11.5–15.5)
WBC: 12.1 10*3/uL — AB (ref 4.0–10.5)

## 2018-08-16 LAB — LIPASE, BLOOD: LIPASE: 26 U/L (ref 11–51)

## 2018-08-16 MED ORDER — ONDANSETRON HCL 4 MG/2ML IJ SOLN
4.0000 mg | Freq: Once | INTRAMUSCULAR | Status: AC
  Administered 2018-08-16: 4 mg via INTRAVENOUS
  Filled 2018-08-16: qty 2

## 2018-08-16 MED ORDER — OXYCODONE-ACETAMINOPHEN 5-325 MG PO TABS: 1.0000 | ORAL_TABLET | Freq: Four times a day (QID) | ORAL | 0 refills | Status: DC | PRN

## 2018-08-16 MED ORDER — MORPHINE SULFATE (PF) 4 MG/ML IV SOLN
4.0000 mg | Freq: Once | INTRAVENOUS | Status: AC
  Administered 2018-08-16: 4 mg via INTRAVENOUS
  Filled 2018-08-16: qty 1

## 2018-08-16 MED ORDER — TAMSULOSIN HCL 0.4 MG PO CAPS: 0.4000 mg | ORAL_CAPSULE | Freq: Every day | ORAL | 0 refills | Status: DC

## 2018-08-16 MED ORDER — SODIUM CHLORIDE 0.9 % IV BOLUS
1000.0000 mL | Freq: Once | INTRAVENOUS | Status: AC
  Administered 2018-08-16: 1000 mL via INTRAVENOUS

## 2018-08-16 MED ORDER — ONDANSETRON HCL 4 MG/2ML IJ SOLN: 4.0000 mg | Freq: Once | INTRAMUSCULAR | Status: DC

## 2018-08-16 MED ORDER — ONDANSETRON HCL 4 MG PO TABS: 4.0000 mg | ORAL_TABLET | Freq: Three times a day (TID) | ORAL | 0 refills | Status: AC | PRN

## 2018-08-16 MED ORDER — KETOROLAC TROMETHAMINE 15 MG/ML IJ SOLN
15.0000 mg | Freq: Once | INTRAMUSCULAR | Status: AC
  Administered 2018-08-16: 15 mg via INTRAVENOUS
  Filled 2018-08-16: qty 1

## 2018-08-16 NOTE — ED Provider Notes (Signed)
[facility] Note   CSN: [id number] Arrival date & time: 08/16/18  1234     History   Chief Complaint Chief Complaint  Patient presents with  . Nausea  . Flank Pain  . Emesis    HPI Alex Sellers is a 22 y.o. male.  22 year old male presents with complaint of right flank pain.  Patient states he was seen here on October 1 and diagnosed with a 9 mm distal kidney stone.  Patient has been taking the pain medication as needed for his pain, states he took the medicine today and it did not help, reports vomiting, feeling hot and cold with chills and sweats.  Pain is constant, nothing makes pain better or worse.  Denies changes in bowel or bladder habits.  No other complaints or concerns.  Patient did not follow-up with urology, states he did not call the office.     History reviewed. No pertinent past medical history.  There are no active problems to display for this patient.   History reviewed. No pertinent surgical history.      Home Medications    Prior to Admission medications   Medication Sig Start Date End Date Taking? Authorizing Provider  cephALEXin (KEFLEX) 500 MG capsule Take 1 capsule (500 mg total) by mouth 2 (two) times daily. 07/31/18  Yes Tilden Fossa, MD  Ibuprofen-diphenhydrAMINE Cit (ADVIL PM) 200-38 MG TABS Take 1 tablet by mouth. Every 30 minutes for sleep and pain   Yes [provider]  oxyCODONE-acetaminophen (PERCOCET/ROXICET) 5-325 MG tablet Take 1-2 tablets by mouth every 6 (six) hours as needed for severe pain. 07/31/18  Yes Cathren Laine, MD  cromolyn (OPTICROM) 4 % ophthalmic solution Place 2 drops into both eyes 4 (four) times daily. Patient not taking: Reported on 07/31/2018 02/22/17   Dorena Bodo, NP  naproxen (NAPROSYN) 500 MG tablet Take 1 tablet (500 mg total) by mouth 2 (two) times daily with a meal. Patient not taking: Reported on 07/31/2018 11/03/14   Marisa Severin, MD  ondansetron  (ZOFRAN ODT) 4 MG disintegrating tablet 4mg  ODT q4 hours prn nausea/vomit Patient not taking: Reported on 07/31/2018 10/17/14   Elwin Mocha, MD  ondansetron (ZOFRAN ODT) 8 MG disintegrating tablet Take 1 tablet (8 mg total) by mouth every 8 (eight) hours as needed for nausea or vomiting. Patient not taking: Reported on 08/16/2018 07/31/18   Cathren Laine, MD    Family History No family history on file.  Social History Social History   Tobacco Use  . Smoking status: Never Smoker  . Smokeless tobacco: Never Used  Substance Use Topics  . Alcohol use: Yes  . Drug use: No     Allergies   Patient has no known allergies.   Review of Systems Review of Systems  Constitutional: Positive for chills, diaphoresis and fever.  Respiratory: Negative for shortness of breath.   Cardiovascular: Negative for chest pain.  Gastrointestinal: Positive for abdominal pain, nausea and vomiting. Negative for abdominal distention, constipation and diarrhea.  Genitourinary: Positive for flank pain. Negative for dysuria and frequency.  Skin: Negative for rash and wound.  Allergic/Immunologic: Negative for immunocompromised state.  Neurological: Negative for dizziness and weakness.  Hematological: Negative for adenopathy.  Psychiatric/Behavioral: Negative for confusion.  All other systems reviewed and are negative.    Physical Exam Updated Vital Signs BP (!) 95/56 (BP Location: Right Arm)   Pulse (!) 45   Temp 97.7 F (36.5 C) (Rectal)   Resp 18   Ht  5\' 5"  (1.651 m)   Wt 65.8 kg   SpO2 100%   BMI 24.13 kg/m   Physical Exam  Constitutional: He is oriented to person, place, and time. He appears well-developed and well-nourished.  HENT:  Head: Normocephalic and atraumatic.  Cardiovascular: Normal rate, regular rhythm, normal heart sounds and intact distal pulses.  No murmur heard. Pulmonary/Chest: Effort normal and breath sounds normal. No respiratory distress.  Abdominal: Soft. There is  tenderness in the right upper quadrant and right lower quadrant. There is no CVA tenderness.  Musculoskeletal: He exhibits no tenderness.  Neurological: He is alert and oriented to person, place, and time. No sensory deficit.  Skin: Skin is warm. He is diaphoretic. No erythema.  Psychiatric: He has a normal mood and affect. His behavior is normal.  Nursing note and vitals reviewed.    ED Treatments / Results  Labs (all labs ordered are listed, but only abnormal results are displayed) Labs Reviewed  COMPREHENSIVE METABOLIC PANEL - Abnormal; Notable for the following components:      Result Value   Potassium 5.4 (*)    CO2 20 (*)    Glucose, Bld 100 (*)    Creatinine, Ser 1.45 (*)    Total Bilirubin 1.8 (*)    Anion gap 17 (*)    All other components within normal limits  CBC - Abnormal; Notable for the following components:   WBC 12.1 (*)    All other components within normal limits  LIPASE, BLOOD  URINALYSIS, ROUTINE W REFLEX MICROSCOPIC    EKG None  Radiology Dg Abdomen 1 View  Result Date: 08/16/2018 CLINICAL DATA:  Right ureteral stone. Right lower quadrant abdominal pain. EXAM: ABDOMEN - 1 VIEW COMPARISON:  CT scan of July 31, 2018. FINDINGS: The bowel gas pattern is normal. Bilateral nephrolithiasis is noted. Distal right ureteral calculus noted on prior CT scan is not visualized currently. IMPRESSION: Distal right ureteral calculus identified on prior CT scan is not definitively identified on this radiograph. Bilateral nephrolithiasis is noted. Electronically Signed   By: Lupita Raider, M.D.   On: 08/16/2018 14:45    Procedures Procedures (including critical care time)  Medications Ordered in ED Medications  sodium chloride 0.9 % bolus 1,000 mL (1,000 mLs Intravenous New Bag/Given 08/16/18 1531)  ondansetron (ZOFRAN) injection 4 mg (4 mg Intravenous Given 08/16/18 1522)  morphine 4 MG/ML injection 4 mg (4 mg Intravenous Given 08/16/18 1522)     Initial  Impression / Assessment and Plan / ED Course  I have reviewed the triage vital signs and the nursing notes.  Pertinent labs & imaging results that were available during my care of the patient were reviewed by me and considered in my medical decision making (see chart for details).  Clinical Course as of Aug 16 1541  Thu Aug 16, 2018  1540 22 yo male with recent diagnosis 9mm distal right ureteral stone on 07/31/18 returns with worsening pain, nausea/vomiting, hot/cold/sweats. On exam, appears to be feeling unwell, vomiting, right side abdominal tenderness no CVA tenderness. KUB does not show stone, discussed with Dr. Madilyn Hook, recommends CT without contrast. Care signed out to Dr. Madilyn Hook pending CT and Ua. Given Morphine, Zofran, fluids.   [LM]    Clinical Course User Index [LM] Jeannie Fend, PA-C   Final Clinical Impressions(s) / ED Diagnoses   Final diagnoses:  None    ED Discharge Orders    None       Jeannie Fend, PA-C 08/16/18 1542  Tilden Fossa, MD 08/19/18 [phone]

## 2018-08-16 NOTE — ED Notes (Signed)
Pt encouraged to provide urine per MD order.  

## 2018-08-16 NOTE — ED Notes (Signed)
This nurse continues to encourage pt to provide urine, reports its painful, pain medication given per order. Will continue to monitor and encourage.

## 2018-08-16 NOTE — ED Triage Notes (Signed)
Patient arrives with c/o right sided flank pain that started today. Patient dx with kidney stones on 07/31/18. +Nausea, vomiting. +RLQ abdominal pain. Patient denies difficulty passing urine.

## 2018-08-16 NOTE — ED Notes (Signed)
Pt off floor with xray 

## 2018-08-16 NOTE — ED Notes (Signed)
Pt attempted to provide urine specimen but was unable to at this time 

## 2018-08-16 NOTE — Discharge Instructions (Signed)
Get rechecked immediately if you develop fevers, uncontrolled pain, can't pee or new concerning symptoms.

## 2018-08-16 NOTE — ED Notes (Signed)
Reminded of need for urine pt states he just attempted to provide specimen

## 2018-08-16 NOTE — ED Notes (Signed)
RN had the IV in place and while obtaining bloodwork, pt yanked his arm away and pulled out the IV.

## 2018-08-17 ENCOUNTER — Telehealth: Payer: Self-pay

## 2018-08-17 NOTE — Care Management Note (Signed)
Case Management Note  CM consulted for no pcp and no ins with need for urology follow up.  CM noted pt was given the urology resident information on AVS, which would be free.  CM sent a message to [facility] CM for possible appointment cancellation openings and that pt will likely need financial counseling to see an attending at urologist.  Roslynn Amble, PA via messages.  No further CM needs noted at this time.  Jurell Basista, Lynnae Sandhoff, RN 08/17/2018, 9:24 AM

## 2018-08-17 NOTE — Telephone Encounter (Signed)
Message received from Eldridge Abrahams, RN CM requesting a hospital follow up appointment for the patient at [facility]. Call placed to the patient and offered him an appointment at [facility] clinic. He said that he would think about it and took the phone # for the clinic.   Update provided to Breck Coons, RN CM

## 2018-08-18 LAB — URINE CULTURE: CULTURE: NO GROWTH

## 2018-10-15 ENCOUNTER — Emergency Department (HOSPITAL_COMMUNITY): Payer: Self-pay

## 2018-10-15 ENCOUNTER — Emergency Department (HOSPITAL_COMMUNITY)
Admission: EM | Admit: 2018-10-15 | Discharge: 2018-10-15 | Disposition: A | Discharge status: Home / Self Care | Payer: Self-pay | Attending: Emergency Medicine | Admitting: Emergency Medicine

## 2018-10-15 ENCOUNTER — Other Ambulatory Visit: Payer: Self-pay

## 2018-10-15 ENCOUNTER — Encounter (HOSPITAL_COMMUNITY): Payer: Self-pay

## 2018-10-15 DIAGNOSIS — N201 Calculus of ureter: Secondary | ICD-10-CM | POA: Insufficient documentation

## 2018-10-15 DIAGNOSIS — R109 Unspecified abdominal pain: Secondary | ICD-10-CM

## 2018-10-15 DIAGNOSIS — Z79899 Other long term (current) drug therapy: Secondary | ICD-10-CM | POA: Insufficient documentation

## 2018-10-15 LAB — COMPREHENSIVE METABOLIC PANEL
ALT: 61 U/L — AB (ref 0–44)
AST: 72 U/L — ABNORMAL HIGH (ref 15–41)
Albumin: 4.4 g/dL (ref 3.5–5.0)
Alkaline Phosphatase: 61 U/L (ref 38–126)
Anion gap: 10 (ref 5–15)
BUN: 14 mg/dL (ref 6–20)
CHLORIDE: 107 mmol/L (ref 98–111)
CO2: 28 mmol/L (ref 22–32)
CREATININE: 1.12 mg/dL (ref 0.61–1.24)
Calcium: 8.9 mg/dL (ref 8.9–10.3)
Glucose, Bld: 93 mg/dL (ref 70–99)
POTASSIUM: 3.4 mmol/L — AB (ref 3.5–5.1)
SODIUM: 145 mmol/L (ref 135–145)
Total Bilirubin: 0.7 mg/dL (ref 0.3–1.2)
Total Protein: 6.4 g/dL — ABNORMAL LOW (ref 6.5–8.1)

## 2018-10-15 LAB — CBC
HEMATOCRIT: 42.1 % (ref 39.0–52.0)
HEMOGLOBIN: 13.7 g/dL (ref 13.0–17.0)
MCH: 32.4 pg (ref 26.0–34.0)
MCHC: 32.5 g/dL (ref 30.0–36.0)
MCV: 99.5 fL (ref 80.0–100.0)
NRBC: 0 % (ref 0.0–0.2)
Platelets: 129 10*3/uL — ABNORMAL LOW (ref 150–400)
RBC: 4.23 MIL/uL (ref 4.22–5.81)
RDW: 13.3 % (ref 11.5–15.5)
WBC: 12.2 10*3/uL — AB (ref 4.0–10.5)

## 2018-10-15 LAB — URINALYSIS, ROUTINE W REFLEX MICROSCOPIC
BACTERIA UA: NONE SEEN
Bilirubin Urine: NEGATIVE
Glucose, UA: NEGATIVE mg/dL
KETONES UR: NEGATIVE mg/dL
Nitrite: NEGATIVE
PH: 6 (ref 5.0–8.0)
PROTEIN: 30 mg/dL — AB
Specific Gravity, Urine: 1.017 (ref 1.005–1.030)

## 2018-10-15 LAB — LIPASE, BLOOD: LIPASE: 26 U/L (ref 11–51)

## 2018-10-15 MED ORDER — IOPAMIDOL (ISOVUE-300) INJECTION 61%
100.0000 mL | Freq: Once | INTRAVENOUS | Status: AC | PRN
  Administered 2018-10-15: 100 mL via INTRAVENOUS

## 2018-10-15 MED ORDER — MORPHINE SULFATE (PF) 4 MG/ML IV SOLN
4.0000 mg | Freq: Once | INTRAVENOUS | Status: AC
  Administered 2018-10-15: 4 mg via INTRAVENOUS
  Filled 2018-10-15: qty 1

## 2018-10-15 MED ORDER — TAMSULOSIN HCL 0.4 MG PO CAPS: 0.4000 mg | ORAL_CAPSULE | Freq: Every day | ORAL | 0 refills | Status: AC

## 2018-10-15 MED ORDER — KETOROLAC TROMETHAMINE 15 MG/ML IJ SOLN
15.0000 mg | Freq: Once | INTRAMUSCULAR | Status: AC
  Administered 2018-10-15: 15 mg via INTRAVENOUS
  Filled 2018-10-15: qty 1

## 2018-10-15 MED ORDER — SODIUM CHLORIDE (PF) 0.9 % IJ SOLN
INTRAMUSCULAR | Status: AC
  Filled 2018-10-15: qty 50

## 2018-10-15 MED ORDER — IBUPROFEN 600 MG PO TABS: 600.0000 mg | ORAL_TABLET | Freq: Three times a day (TID) | ORAL | 0 refills | Status: AC | PRN

## 2018-10-15 MED ORDER — SODIUM CHLORIDE 0.9 % IV BOLUS
1000.0000 mL | Freq: Once | INTRAVENOUS | Status: AC
  Administered 2018-10-15: 1000 mL via INTRAVENOUS

## 2018-10-15 MED ORDER — IOPAMIDOL (ISOVUE-300) INJECTION 61%
INTRAVENOUS | Status: AC
  Filled 2018-10-15: qty 100

## 2018-10-15 MED ORDER — OXYCODONE-ACETAMINOPHEN 5-325 MG PO TABS: 1.0000 | ORAL_TABLET | ORAL | 0 refills | Status: AC | PRN

## 2018-10-15 NOTE — ED Notes (Signed)
US at bedside

## 2018-10-15 NOTE — ED Notes (Signed)
Patient transported to X-ray 

## 2018-10-15 NOTE — ED Notes (Signed)
Pt made aware urine needed. Urinal provided 

## 2018-10-15 NOTE — Discharge Instructions (Addendum)
If you develop worsening or uncontrolled pain, fever, vomiting, burning when you urinate, or any other new/concerning symptoms and return to the ER for evaluation.  Otherwise follow-up with a urologist for outpatient treatment of your kidney stones.  When you passed the kidney stone you may stop the Flomax/tamsulosin

## 2018-10-15 NOTE — ED Triage Notes (Signed)
Pt arrives via EMS from home. Pt reports about 5am he began having RLQ pain with N/V. Per EMS pt had moderate amount of clear emesis. EMS reports that they administered 50mcg IV Fentanyl for pain.Pt reported to EMS that he also took a percocet this am between 5-630. Pt reported fevers at home.

## 2018-10-15 NOTE — ED Provider Notes (Signed)
[facility]-EMERGENCY DEPT Provider Note   CSN: [id number] Arrival date & time: 10/15/18  4098     History   Chief Complaint Chief Complaint  Patient presents with  . Abdominal Pain    HPI Lowry Bala is a 22 y.o. male.  HPI  22 year old male presents with right-sided abdominal pain.  Started around 3 AM this morning.  Seem to wake him up.  He is been noticing a little bit of difficulty urinating for couple days but no specific dysuria or hematuria.  However he states is not quite normal.  The pain this morning seems to occasionally go into his right groin.  There is no back pain.  He is vomited about 3 times.  EMS gave him fentanyl and he is feeling much better.  The pain was around 8 earlier and now is down to a 3.  The pain is mostly in his right upper quadrant.  He also took a Microbiologist he had left over from October.  He states back then he had a kidney stone that he passed on his own.  He states this feels very similar.  No previous abdominal surgeries.  No fevers.  History reviewed. No pertinent past medical history.  There are no active problems to display for this patient.   History reviewed. No pertinent surgical history.      Home Medications    Prior to Admission medications   Medication Sig Start Date End Date Taking? Authorizing Provider  Ibuprofen-diphenhydrAMINE Cit (ADVIL PM) 200-38 MG TABS Take 1 tablet by mouth. Every 30 minutes for sleep and pain   Yes [provider]  cephALEXin (KEFLEX) 500 MG capsule Take 1 capsule (500 mg total) by mouth 2 (two) times daily. Patient not taking: Reported on 10/15/2018 07/31/18   Tilden Fossa, MD  cromolyn (OPTICROM) 4 % ophthalmic solution Place 2 drops into both eyes 4 (four) times daily. Patient not taking: Reported on 07/31/2018 02/22/17   Dorena Bodo, NP  ibuprofen (ADVIL,MOTRIN) 600 MG tablet Take 1 tablet (600 mg total) by mouth every 8 (eight) hours as needed. 10/15/18    Pricilla Loveless, MD  naproxen (NAPROSYN) 500 MG tablet Take 1 tablet (500 mg total) by mouth 2 (two) times daily with a meal. Patient not taking: Reported on 07/31/2018 11/03/14   Marisa Severin, MD  ondansetron (ZOFRAN ODT) 4 MG disintegrating tablet 4mg  ODT q4 hours prn nausea/vomit Patient not taking: Reported on 07/31/2018 10/17/14   Elwin Mocha, MD  ondansetron (ZOFRAN ODT) 8 MG disintegrating tablet Take 1 tablet (8 mg total) by mouth every 8 (eight) hours as needed for nausea or vomiting. Patient not taking: Reported on 08/16/2018 07/31/18   Cathren Laine, MD  ondansetron (ZOFRAN) 4 MG tablet Take 1 tablet (4 mg total) by mouth every 8 (eight) hours as needed for nausea or vomiting. Patient not taking: Reported on 10/15/2018 08/16/18   Tilden Fossa, MD  oxyCODONE-acetaminophen (PERCOCET) 5-325 MG tablet Take 1-2 tablets by mouth every 4 (four) hours as needed for severe pain. 10/15/18   Pricilla Loveless, MD  tamsulosin (FLOMAX) 0.4 MG CAPS capsule Take 1 capsule (0.4 mg total) by mouth daily. 10/15/18   Pricilla Loveless, MD    Family History History reviewed. No pertinent family history.  Social History Social History   Tobacco Use  . Smoking status: Never Smoker  . Smokeless tobacco: Never Used  Substance Use Topics  . Alcohol use: Yes  . Drug use: No     Allergies  Patient has no known allergies.   Review of Systems Review of Systems  Constitutional: Negative for fever.  Respiratory: Negative for shortness of breath.   Cardiovascular: Negative for chest pain.  Gastrointestinal: Positive for abdominal pain, nausea and vomiting.  Genitourinary: Negative for dysuria and hematuria.  Musculoskeletal: Negative for back pain.  All other systems reviewed and are negative.    Physical Exam Updated Vital Signs BP 116/75   Pulse (!) 48   Temp (!) 96.7 F (35.9 C) (Axillary)   Resp 17   Ht 5\' 5"  (1.651 m)   Wt 65.8 kg   SpO2 100%   BMI 24.13 kg/m   Physical  Exam Vitals signs and nursing note reviewed.  Constitutional:      General: He is not in acute distress.    Appearance: He is well-developed. He is not ill-appearing or diaphoretic.     Comments: Resting comfortably  HENT:     Head: Normocephalic and atraumatic.     Right Ear: External ear normal.     Left Ear: External ear normal.     Nose: Nose normal.  Eyes:     General:        Right eye: No discharge.        Left eye: No discharge.  Neck:     Musculoskeletal: Neck supple.  Cardiovascular:     Rate and Rhythm: Regular rhythm. Bradycardia present.     Heart sounds: Normal heart sounds.  Pulmonary:     Effort: Pulmonary effort is normal.     Breath sounds: Normal breath sounds.  Abdominal:     Palpations: Abdomen is soft.     Tenderness: There is abdominal tenderness (mild) in the right upper quadrant. There is no right CVA tenderness or left CVA tenderness.  Skin:    General: Skin is warm and dry.  Neurological:     Mental Status: He is alert.  Psychiatric:        Mood and Affect: Mood is not anxious.      ED Treatments / Results  Labs (all labs ordered are listed, but only abnormal results are displayed) Labs Reviewed  COMPREHENSIVE METABOLIC PANEL - Abnormal; Notable for the following components:      Result Value   Potassium 3.4 (*)    Total Protein 6.4 (*)    AST 72 (*)    ALT 61 (*)    All other components within normal limits  CBC - Abnormal; Notable for the following components:   WBC 12.2 (*)    Platelets 129 (*)    All other components within normal limits  URINALYSIS, ROUTINE W REFLEX MICROSCOPIC - Abnormal; Notable for the following components:   APPearance HAZY (*)    Hgb urine dipstick LARGE (*)    Protein, ur 30 (*)    Leukocytes, UA TRACE (*)    RBC / HPF >50 (*)    All other components within normal limits  LIPASE, BLOOD    EKG None  Radiology Dg Abdomen 1 View  Result Date: 10/15/2018 CLINICAL DATA:  Right-sided pain EXAM: ABDOMEN  - 1 VIEW COMPARISON:  August 16, 2018 FINDINGS: There are multiple calculi throughout each kidney. Calculi on the right range in size from as small as 1 mm to as large as 4 mm. Calculi on the left are of similar size. There is a 6 x 4 mm calcification in the mid right pelvis, not present on prior study. There are probable small phleboliths in the pelvis  as well. There is moderate stool in the colon. There is no bowel dilatation or air-fluid level to suggest bowel obstruction. No free air. IMPRESSION: Suspected distal right ureteral calculus measuring 6 x 4 mm. Multiple small calculi in each kidney noted. No bowel obstruction or free air evident. Electronically Signed   By: Bretta Bang III M.D.   On: 10/15/2018 08:23   Ct Abdomen Pelvis W Contrast  Result Date: 10/15/2018 CLINICAL DATA:  Nausea, vomiting, abdominal pain EXAM: CT ABDOMEN AND PELVIS WITH CONTRAST TECHNIQUE: Multidetector CT imaging of the abdomen and pelvis was performed using the standard protocol following bolus administration of intravenous contrast. CONTRAST:  ISOVUE-300 IOPAMIDOL (ISOVUE-300) INJECTION 61% COMPARISON:  None. FINDINGS: Lower chest: No acute abnormality. Hepatobiliary: No focal liver abnormality is seen. No gallstones, gallbladder wall thickening, or biliary dilatation. Pancreas: Unremarkable. No pancreatic ductal dilatation or surrounding inflammatory changes. Spleen: Normal in size without focal abnormality. Adrenals/Urinary Tract: Normal adrenal glands. Bilateral nephrolithiasis. No obstructive uropathy. 7 mm distal right ureteral calculus with a second smaller more proximal distal right ureteral calculus measuring 3 mm. No urolithiasis. Stomach/Bowel: Stomach is within normal limits. Appendix appears normal. No evidence of bowel wall thickening, distention, or inflammatory changes. Vascular/Lymphatic: No significant vascular findings are present. No enlarged abdominal or pelvic lymph nodes. Reproductive:  Prostate is unremarkable. Other: No abdominal wall hernia or abnormality. No abdominopelvic ascites. Musculoskeletal: No acute or significant osseous findings. IMPRESSION: 1. Bilateral nephrolithiasis. No obstructive uropathy. 7 mm distal right ureteral calculus with a second smaller more proximal distal right ureteral calculus measuring 3 mm. Electronically Signed   By: Elige Ko   On: 10/15/2018 11:24   US Renal  Result Date: 10/15/2018 CLINICAL DATA:  Right flank pain EXAM: RENAL / URINARY TRACT ULTRASOUND COMPLETE COMPARISON:  None. FINDINGS: Right Kidney: Renal measurements: 10.3 x 5.3 x 5.3 cm = volume: 151.6 mL . Echogenicity and renal cortical thickness are within normal limits. No mass, perinephric fluid, or hydronephrosis visualized. There is a 5 mm calculus in the mid right kidney. No ureterectasis. Left Kidney: Renal measurements: 10.2 x 5.8 x 5.3 cm = volume: 161.0 mL. Echogenicity within normal limits. No mass, perinephric fluid, or hydronephrosis visualized. No sonographically demonstrable calculus or ureterectasis. Bladder: Within normal limits for degree of distention. Prostate measures 3.3 x 2.9 x 3.8 cm with homogeneous echotexture. IMPRESSION: 5 mm nonobstructing calculus mid right kidney. Study otherwise unremarkable. Electronically Signed   By: Bretta Bang III M.D.   On: 10/15/2018 10:37   US Abdomen Limited Ruq  Result Date: 10/15/2018 CLINICAL DATA:  Right-sided pain EXAM: ULTRASOUND ABDOMEN LIMITED RIGHT UPPER QUADRANT COMPARISON:  None. FINDINGS: Gallbladder: No gallstones are appreciable. Portions of the anterior gallbladder wall region appear edematous. The remainder of the wall appears unremarkable. No pericholecystic fluid is demonstrable. No sonographic Murphy sign noted by sonographer. Common bile duct: Diameter: 2 mm. No intrahepatic or extrahepatic biliary duct dilatation. There is trace ascites. Liver: No focal lesion identified. Within normal limits in  parenchymal echogenicity. Portal vein is patent on color Doppler imaging with normal direction of blood flow towards the liver. IMPRESSION: Trace ascites. A portion of the gallbladder wall appears somewhat thickened and edematous. Ascites may result in this finding. A degree of acalculus cholecystitis is also possible in this circumstance. This finding may warrant correlation with nuclear medicine hepatobiliary imaging study to assess for cystic duct patency. Study otherwise unremarkable. Electronically Signed   By: Bretta Bang III M.D.   On: 10/15/2018 09:41  Procedures Procedures (including critical care time)  Medications Ordered in ED Medications  sodium chloride 0.9 % bolus 1,000 mL (0 mLs Intravenous Stopped 10/15/18 0929)  morphine 4 MG/ML injection 4 mg (4 mg Intravenous Given 10/15/18 0834)  iopamidol (ISOVUE-300) 61 % injection 100 mL (100 mLs Intravenous Contrast Given 10/15/18 1059)  ketorolac (TORADOL) 15 MG/ML injection 15 mg (15 mg Intravenous Given 10/15/18 1155)     Initial Impression / Assessment and Plan / ED Course  I have reviewed the triage vital signs and the nursing notes.  Pertinent labs & imaging results that were available during my care of the patient were reviewed by me and considered in my medical decision making (see chart for details).     The patient likely has a right ureteral stone causing all of his symptoms.  I tried to avoid CT at first with an ultrasound but given his right upper quadrant pain also did an ultrasound of his gallbladder.  This had some vague abnormality so a CT was obtained.  These abnormalities are not seen on the CT and instead he has 2 ureteral stones, one 7 mm and one 2 mm.  He will be prescribed Percocet and ibuprofen for his pain control as well as placed on Flomax given the size.  He does not have any acute infectious symptoms and otherwise appears stable for discharge home given good pain control. We discussed return  precautions and need for urology f/u.  Final Clinical Impressions(s) / ED Diagnoses   Final diagnoses:  Right sided abdominal pain  Right ureteral stone    ED Discharge Orders         Ordered    oxyCODONE-acetaminophen (PERCOCET) 5-325 MG tablet  Every 4 hours PRN     10/15/18 1140    tamsulosin (FLOMAX) 0.4 MG CAPS capsule  Daily     10/15/18 1140    ibuprofen (ADVIL,MOTRIN) 600 MG tablet  Every 8 hours PRN     10/15/18 1140           Pricilla Loveless, MD 10/15/18 1541

## 2018-10-15 NOTE — ED Notes (Signed)
Patient transported to CT 

## 2019-07-26 IMAGING — US US RENAL
1 series · 14 of 25 positions shown · non-contrast
Comparison: None.

CLINICAL DATA: Right flank pain

EXAM:
RENAL / URINARY TRACT ULTRASOUND COMPLETE

[Series 1: us renal · 67 acquisitions, 14 frames shown]
[im 1/67]
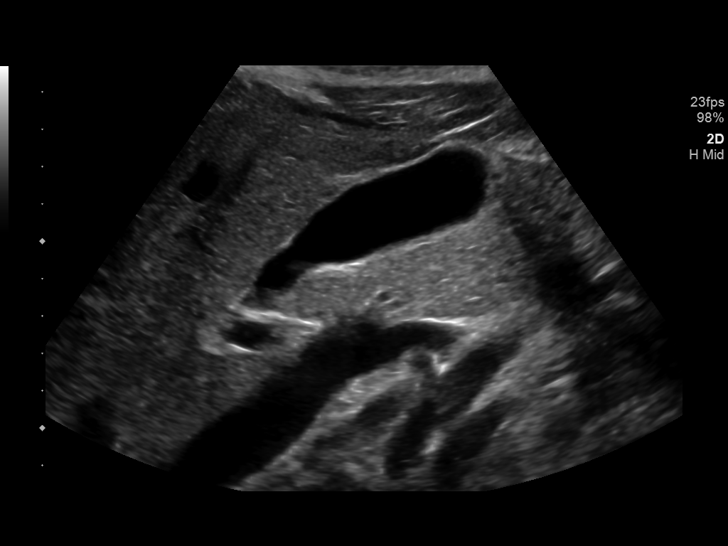
[im 6/67]
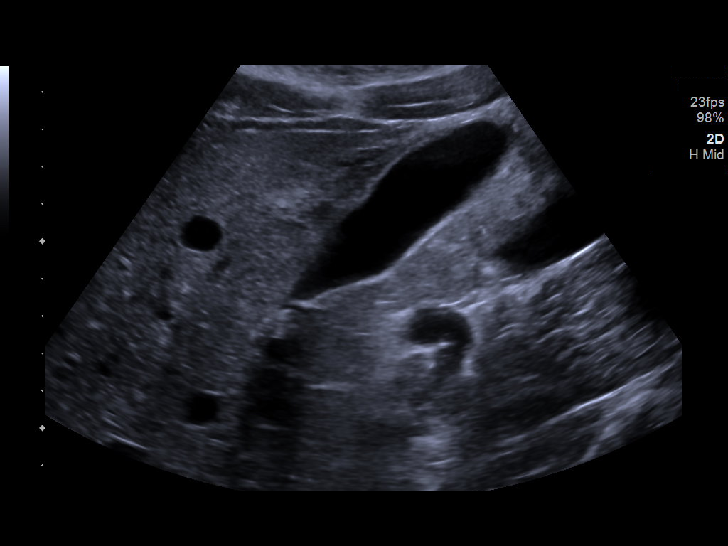
[im 12/67]
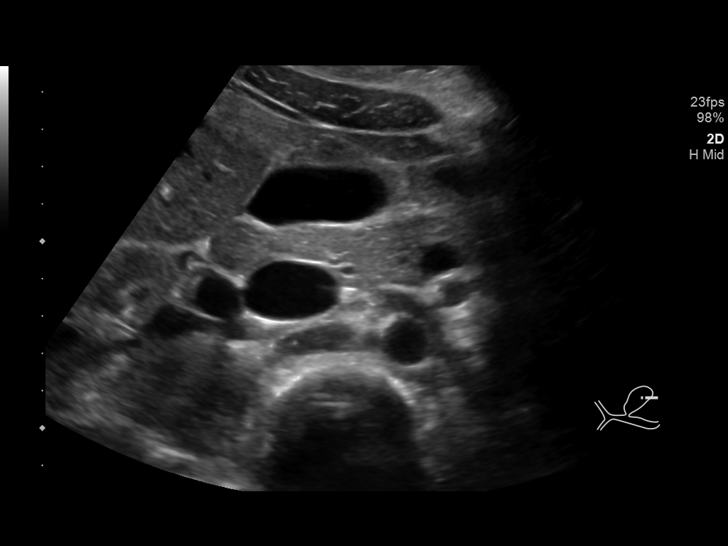
[im 17/67]
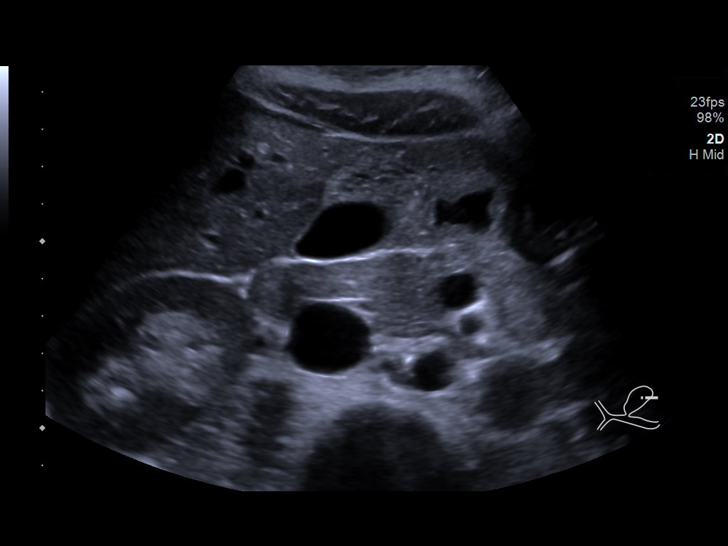
[im 23/67]
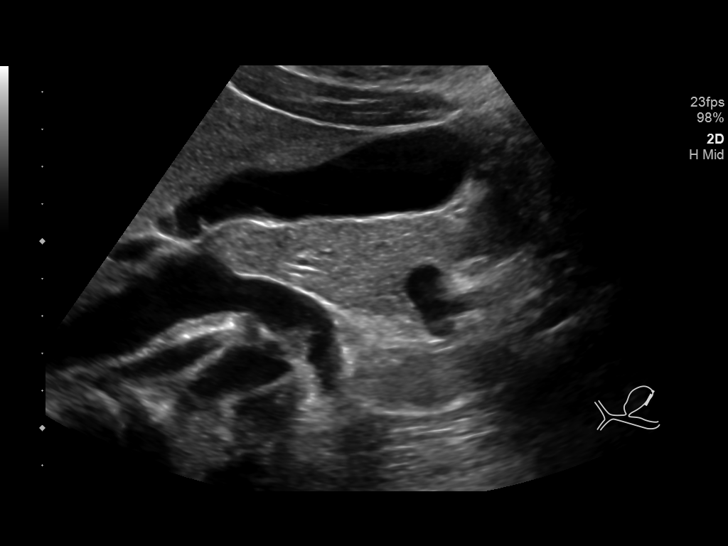
[im 25/67]
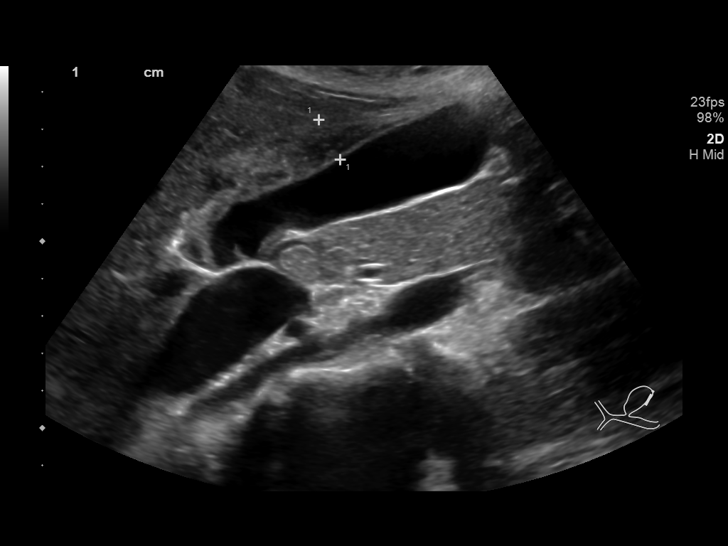
[im 31/67]
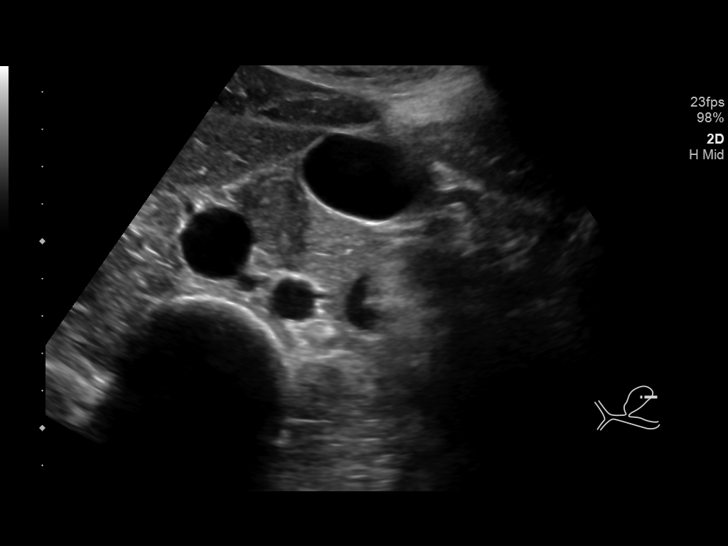
[im 36/67]
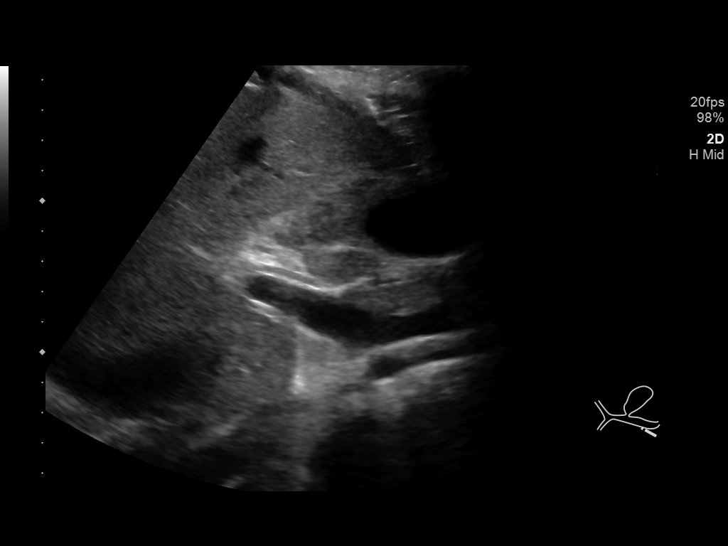
[im 42/67]
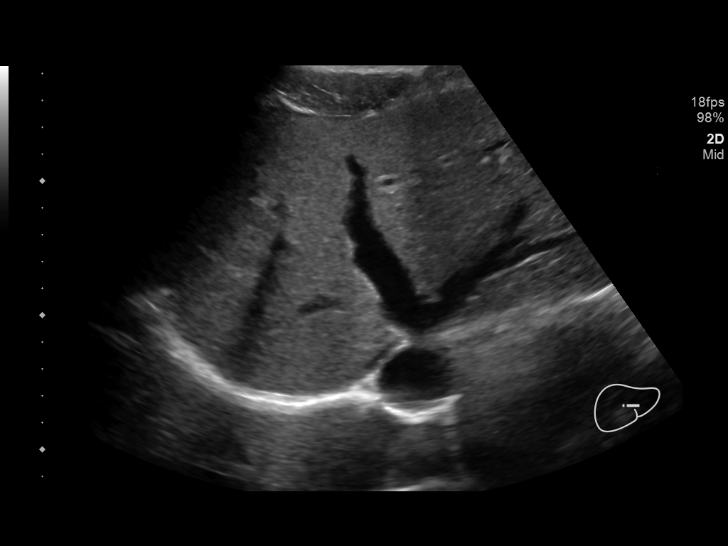
[im 45/67]
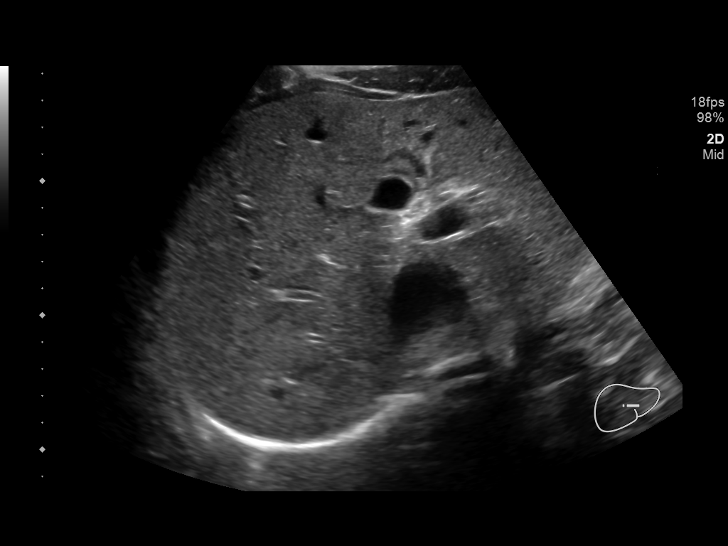
[im 50/67]
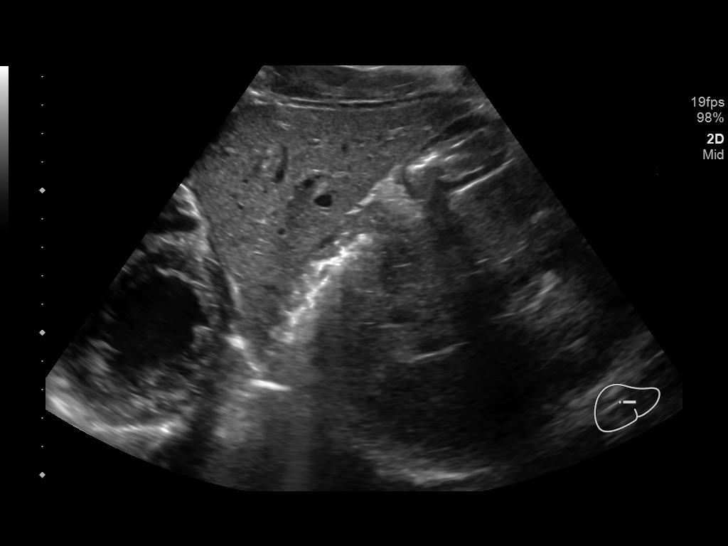
[im 56/67]
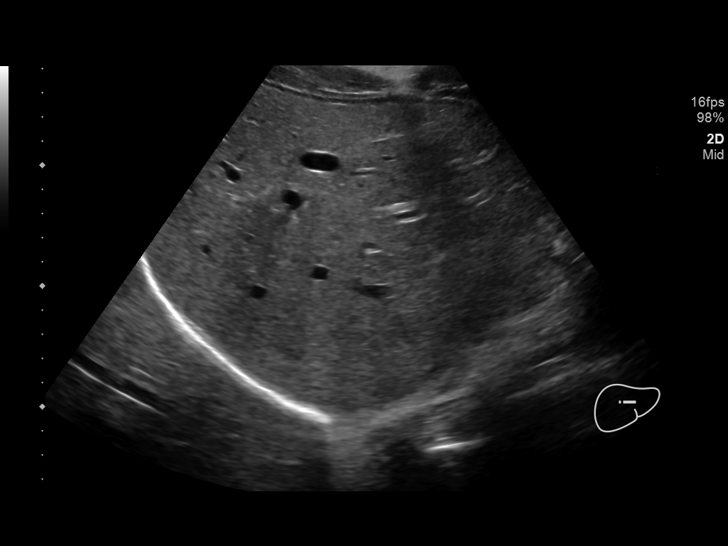
[im 61/67]
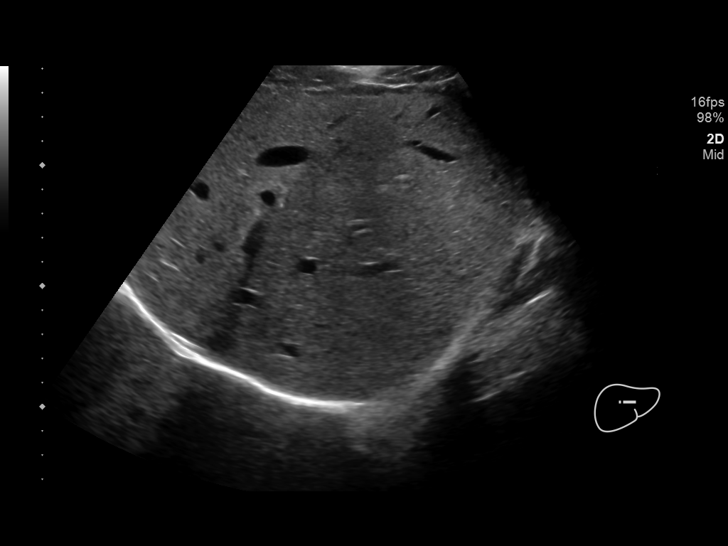
[im 67/67]
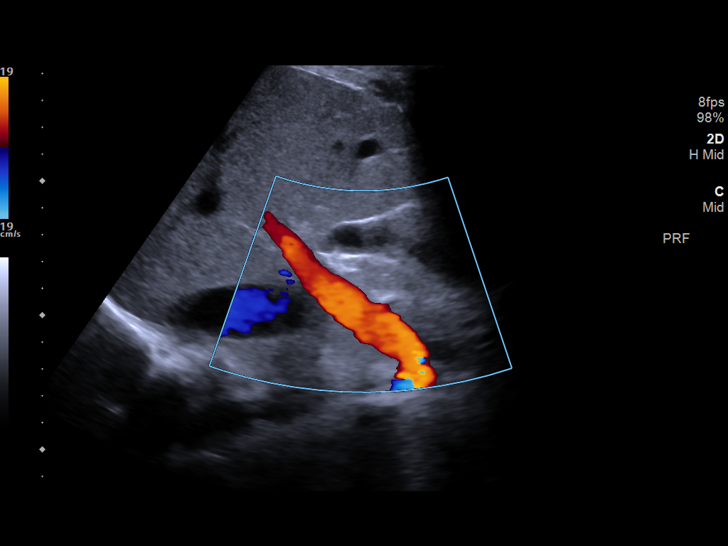

[14 of 25 positions shown; findings below may reference images not displayed]

FINDINGS: Right Kidney:

Renal measurements: 10.3 x 5.3 x 5.3 cm = volume: 151.6 mL .
Echogenicity and renal cortical thickness are within normal limits.
No mass, perinephric fluid, or hydronephrosis visualized. There is a
5 mm calculus in the mid right kidney. No ureterectasis.

Left Kidney:

Renal measurements: 10.2 x 5.8 x 5.3 cm = volume: 161.0 mL.
Echogenicity within normal limits. No mass, perinephric fluid, or
hydronephrosis visualized. No sonographically demonstrable calculus
or ureterectasis.

Bladder:

Within normal limits for degree of distention.

Prostate measures 3.3 x 2.9 x 3.8 cm with homogeneous echotexture.
IMPRESSION: 5 mm nonobstructing calculus mid right kidney. Study otherwise
unremarkable.

## 2020-11-24 ENCOUNTER — Other Ambulatory Visit: Payer: Self-pay

## 2020-11-24 ENCOUNTER — Emergency Department (HOSPITAL_COMMUNITY): Payer: Self-pay

## 2020-11-24 ENCOUNTER — Emergency Department (HOSPITAL_COMMUNITY)
Admission: EM | Admit: 2020-11-24 | Discharge: 2020-11-24 | Disposition: A | Discharge status: Home / Self Care | Payer: Self-pay | Attending: Emergency Medicine | Admitting: Emergency Medicine

## 2020-11-24 ENCOUNTER — Encounter (HOSPITAL_COMMUNITY): Payer: Self-pay

## 2020-11-24 DIAGNOSIS — K649 Unspecified hemorrhoids: Secondary | ICD-10-CM

## 2020-11-24 DIAGNOSIS — N3000 Acute cystitis without hematuria: Secondary | ICD-10-CM

## 2020-11-24 DIAGNOSIS — M25562 Pain in left knee: Secondary | ICD-10-CM

## 2020-11-24 LAB — URINALYSIS, ROUTINE W REFLEX MICROSCOPIC
Bilirubin Urine: NEGATIVE
Glucose, UA: NEGATIVE mg/dL
Hgb urine dipstick: NEGATIVE
Ketones, ur: NEGATIVE mg/dL
Nitrite: NEGATIVE
Protein, ur: NEGATIVE mg/dL
Specific Gravity, Urine: 1.023 (ref 1.005–1.030)
WBC, UA: >50 WBC/hpf — ABNORMAL HIGH (ref 0–5)
pH: 5 (ref 5.0–8.0)

## 2020-11-24 MED ORDER — CEPHALEXIN 500 MG PO CAPS
500.0000 mg | ORAL_CAPSULE | Freq: Once | ORAL | Status: AC
  Administered 2020-11-24: 500 mg via ORAL
  Filled 2020-11-24: qty 1

## 2020-11-24 MED ORDER — NAPROXEN 500 MG PO TABS: 500.0000 mg | ORAL_TABLET | Freq: Two times a day (BID) | ORAL | 0 refills | Status: AC

## 2020-11-24 MED ORDER — HYDROCORTISONE (PERIANAL) 2.5 % EX CREA: 1.0000 "application " | TOPICAL_CREAM | Freq: Two times a day (BID) | CUTANEOUS | 0 refills | Status: AC

## 2020-11-24 MED ORDER — CEPHALEXIN 500 MG PO CAPS: 500.0000 mg | ORAL_CAPSULE | Freq: Four times a day (QID) | ORAL | 0 refills | Status: AC

## 2020-11-24 NOTE — Discharge Instructions (Addendum)
As discussed, your rectal pain is most likely due to your external hemorrhoid. I am sending home with hemorrhoid cream. Use twice a day as needed for pain. Your x-ray was negative for any broken bones. You may use the knee brace and crutches as needed for comfort. Continue to ice and elevate your left knee. I have included the number of the orthopedic surgeon. Please follow-up if symptoms not improved within the next week. Return to the ER for new or worsening symptoms.  Your urine showed a possible UTI. I am sending you home with an antibiotic. Take as prescribed and finish all antibiotics. Follow-up with PCP if symptoms do not improve after treatment.

## 2020-11-24 NOTE — ED Triage Notes (Signed)
Patient arrived stating he is having left knee pain over the last week and rectal pain over the last 2-3 weeks. Last dose of aleve yesterday. Patient ambulatory in triage.

## 2020-11-24 NOTE — ED Provider Notes (Signed)
[facility]-EMERGENCY DEPT Provider Note   CSN: [id number] Arrival date & time: 11/24/20  0240     History Chief Complaint  Patient presents with  . Knee Pain    Alex Sellers is a 25 y.o. male with no significant past medical history presents to the ED due to complaints of left knee pain and rectal pain. Patient states left knee pain started during ambulation roughly 1 week ago. Pain is worse with movement especially ambulation. Pain is located on the lateral aspect of the left knee. Denies direct injury. He has been taking Aleve with moderate relief. Denies associated edema, erythema, and warmth. No fever or chills. Denies associated numbness/tingling. He notes he is able to fully extend and flex left lower extremity. No previous left knee injury.  Patient also admits to intermittent rectal pain for the past 2 to 3 weeks. Patient states pain is present after bowel movements, not during. Admits to one episode of streaks of blood in his stool roughly 3 weeks ago, but none since. Denies associated testicular pain, pain with BM (pain is present after), penile discharge. He admits to intermittent dysuria over the past few days. Denies abdominal pain, nausea, vomiting, diarrhea. He is currently sexually active with females with low suspicion for STDs. Denies any anal intercourse. No fever or chills.  History obtained from patient and past medical records. No interpreter used during encounter.      History reviewed. No pertinent past medical history.  There are no problems to display for this patient.   History reviewed. No pertinent surgical history.     No family history on file.  Social History   Tobacco Use  . Smoking status: Never Smoker  . Smokeless tobacco: Never Used  Substance Use Topics  . Alcohol use: Yes  . Drug use: No    Home Medications Prior to Admission medications   Medication Sig Start Date End Date Taking? Authorizing Provider   cephALEXin (KEFLEX) 500 MG capsule Take 1 capsule (500 mg total) by mouth 4 (four) times daily. 11/24/20  Yes Stuart Guillen, Merla Riches, PA-C  hydrocortisone (ANUSOL-HC) 2.5 % rectal cream Place 1 application rectally 2 (two) times daily. 11/24/20  Yes Dayanna Pryce C, PA-C  naproxen (NAPROSYN) 500 MG tablet Take 1 tablet (500 mg total) by mouth 2 (two) times daily. 11/24/20  Yes Jimya Ciani, Merla Riches, PA-C  cephALEXin (KEFLEX) 500 MG capsule Take 1 capsule (500 mg total) by mouth 2 (two) times daily. Patient not taking: Reported on 10/15/2018 07/31/18   Tilden Fossa, MD  cromolyn (OPTICROM) 4 % ophthalmic solution Place 2 drops into both eyes 4 (four) times daily. Patient not taking: Reported on 07/31/2018 02/22/17   Dorena Bodo, NP  ibuprofen (ADVIL,MOTRIN) 600 MG tablet Take 1 tablet (600 mg total) by mouth every 8 (eight) hours as needed. 10/15/18   Pricilla Loveless, MD  Ibuprofen-diphenhydrAMINE Cit (ADVIL PM) 200-38 MG TABS Take 1 tablet by mouth. Every 30 minutes for sleep and pain    [provider]  naproxen (NAPROSYN) 500 MG tablet Take 1 tablet (500 mg total) by mouth 2 (two) times daily with a meal. Patient not taking: Reported on 07/31/2018 11/03/14   Marisa Severin, MD  ondansetron (ZOFRAN ODT) 4 MG disintegrating tablet 4mg  ODT q4 hours prn nausea/vomit Patient not taking: Reported on 07/31/2018 10/17/14   10/19/14, MD  ondansetron (ZOFRAN ODT) 8 MG disintegrating tablet Take 1 tablet (8 mg total) by mouth every 8 (eight) hours as needed for nausea  or vomiting. Patient not taking: Reported on 08/16/2018 07/31/18   Cathren Laine, MD  ondansetron (ZOFRAN) 4 MG tablet Take 1 tablet (4 mg total) by mouth every 8 (eight) hours as needed for nausea or vomiting. Patient not taking: Reported on 10/15/2018 08/16/18   Tilden Fossa, MD  oxyCODONE-acetaminophen (PERCOCET) 5-325 MG tablet Take 1-2 tablets by mouth every 4 (four) hours as needed for severe pain. 10/15/18   Pricilla Loveless,  MD  tamsulosin (FLOMAX) 0.4 MG CAPS capsule Take 1 capsule (0.4 mg total) by mouth daily. 10/15/18   Pricilla Loveless, MD    Allergies    Patient has no known allergies.  Review of Systems   Review of Systems  Constitutional: Negative for chills and fever.  Gastrointestinal: Positive for blood in stool (one episode) and rectal pain. Negative for abdominal pain, constipation, diarrhea, nausea and vomiting.  Genitourinary: Positive for dysuria.  Musculoskeletal: Positive for arthralgias.  All other systems reviewed and are negative.   Physical Exam Updated Vital Signs BP 122/84 (BP Location: Right Arm)   Pulse (!) 56   Temp 98.3 F (36.8 C) (Oral)   Resp 16   SpO2 100%   Physical Exam Vitals and nursing note reviewed.  Constitutional:      General: He is not in acute distress.    Appearance: He is not ill-appearing.  HENT:     Head: Normocephalic.  Eyes:     Pupils: Pupils are equal, round, and reactive to light.  Cardiovascular:     Rate and Rhythm: Normal rate and regular rhythm.     Pulses: Normal pulses.     Heart sounds: Normal heart sounds. No murmur heard. No friction rub. No gallop.   Pulmonary:     Effort: Pulmonary effort is normal.     Breath sounds: Normal breath sounds.  Abdominal:     General: Abdomen is flat. Bowel sounds are normal. There is no distension.     Palpations: Abdomen is soft.     Tenderness: There is no abdominal tenderness. There is no guarding or rebound.     Comments: Abdomen soft, nondistended, nontender to palpation in all quadrants without guarding or peritoneal signs. No rebound.   Genitourinary:    Comments: Rectal exam performed with chaperone in room. 1 external hemorrhoid present. No evidence of thrombosed hemorrhoid. No tenderness or edema throughout scrotum/testicles. No penile discharge. Musculoskeletal:     Cervical back: Neck supple.     Comments: No tenderness over left knee. Full extension and flexion of left knee. No  erythema, edema, or warmth. No bony tenderness at left hip or left ankle. Patient able to ambulate in the ED without difficulty.  Skin:    General: Skin is warm and dry.  Neurological:     General: No focal deficit present.     Mental Status: He is alert.  Psychiatric:        Mood and Affect: Mood normal.        Behavior: Behavior normal.     ED Results / Procedures / Treatments   Labs (all labs ordered are listed, but only abnormal results are displayed) Labs Reviewed  URINALYSIS, ROUTINE W REFLEX MICROSCOPIC - Abnormal; Notable for the following components:      Result Value   APPearance HAZY (*)    Leukocytes,Ua MODERATE (*)    WBC, UA >50 (*)    Bacteria, UA RARE (*)    All other components within normal limits  URINE CULTURE  GC/CHLAMYDIA PROBE AMP (  [place]) NOT AT [facility]    EKG None  Radiology DG Knee Complete 4 Views Left  Result Date: 11/24/2020 CLINICAL DATA:  Left knee pain for 1 week EXAM: LEFT KNEE - COMPLETE 4+ VIEW COMPARISON:  None. FINDINGS: No evidence of fracture, dislocation, or joint effusion. No evidence of arthropathy or other focal bone abnormality. Soft tissues are unremarkable. IMPRESSION: Negative. Electronically Signed   By: Duanne Guess D.O.   On: 11/24/2020 13:17    Procedures Procedures   Medications Ordered in ED Medications  cephALEXin (KEFLEX) capsule 500 mg (has no administration in time range)    ED Course  I have reviewed the triage vital signs and the nursing notes.  Pertinent labs & imaging results that were available during my care of the patient were reviewed by me and considered in my medical decision making (see chart for details).  Clinical Course as of 11/24/20 1328  Tue Nov 24, 2020  1313 Glori Luis): MODERATE [CA]    Clinical Course User Index [CA] Mannie Stabile, PA-C   MDM Rules/Calculators/A&P                         25 year old male presents to the ED due to rectal pain that has been ongoing  for the past 2 to 3 weeks present after a bowel movement and left knee pain x1 week. Patient admits to one episode of streaks of blood in stool roughly 3 weeks ago, but none since. Upon arrival, stable vitals. Patient is afebrile, not tachycardic or hypoxic. Patient in no acute distress and nontoxic-appearing. Physical exam reassuring. Abdomen soft, nondistended, nontender. On external hemorrhoid present without evidence of thrombosis. Normal circumcised penis without lesions or discharge. No testicular tenderness or edema. No tenderness to left knee. No erythema, edema, or warmth. Full ROM of left knee. LLE neurovascularly intact. X-ray ordered to rule out bony fracture. UA to rule out acute cystitis given intermittent dysuria. Gonorrhea/chlamydia sent to lab to rule out STI.   X-ray personally reviewed which is negative for any bony fractures. Knee brace and crutches given for symptomatic relief. RICE discussed with patient. Orthopedics number given to patient at discharge and instructed patient to call if symptoms do not improve within the next week. UA moderate leukocytes and rare bacteria.  Urine culture pending.  Given patient is symptomatic will treat with Keflex.  Patient discharged with naproxen for knee pain and anusol cream for hemorrhoids. Recommend PCP follow-up if symptoms do not improve. Strict ED precautions discussed with patient. Patient states understanding and agrees to plan. Patient discharged home in no acute distress and stable vitals.  Final Clinical Impression(s) / ED Diagnoses Final diagnoses:  Acute pain of left knee  Acute hemorrhoid  Acute cystitis without hematuria    Rx / DC Orders ED Discharge Orders         Ordered    hydrocortisone (ANUSOL-HC) 2.5 % rectal cream  2 times daily        11/24/20 1227    cephALEXin (KEFLEX) 500 MG capsule  4 times daily        11/24/20 1316    naproxen (NAPROSYN) 500 MG tablet  2 times daily        11/24/20 1328            Jesusita Oka 11/24/20 1328    Gerhard Munch, MD 11/24/20 1636

## 2020-11-24 NOTE — ED Notes (Signed)
An After Visit Summary was printed and given to the patient. Discharge instructions given and no further questions at this time.  

## 2020-11-25 LAB — GC/CHLAMYDIA PROBE AMP (~~LOC~~) NOT AT ARMC
Chlamydia: NEGATIVE
Comment: NEGATIVE
Comment: NORMAL
Neisseria Gonorrhea: NEGATIVE

## 2020-11-26 LAB — URINE CULTURE: Culture: NO GROWTH
